# Patient Record
Sex: Female | Born: 1970 | Race: White | Hispanic: No | Marital: Single | Smoking: Never smoker
Health system: Southern US, Community
[De-identification: ages and names within clinical notes are randomized; demographics above are authoritative.]

## PROBLEM LIST (undated history)

## (undated) DIAGNOSIS — Z9889 Other specified postprocedural states: Secondary | ICD-10-CM

## (undated) DIAGNOSIS — N200 Calculus of kidney: Secondary | ICD-10-CM

## (undated) DIAGNOSIS — E119 Type 2 diabetes mellitus without complications: Secondary | ICD-10-CM

## (undated) DIAGNOSIS — E785 Hyperlipidemia, unspecified: Secondary | ICD-10-CM

## (undated) DIAGNOSIS — Z87442 Personal history of urinary calculi: Secondary | ICD-10-CM

## (undated) HISTORY — DX: Calculus of kidney: N20.0

## (undated) HISTORY — DX: Hyperlipidemia, unspecified: E78.5

## (undated) HISTORY — DX: Type 2 diabetes mellitus without complications: E11.9

## (undated) HISTORY — PX: CARPAL TUNNEL RELEASE: SHX101

## (undated) HISTORY — PX: WISDOM TOOTH EXTRACTION: SHX21

## (undated) HISTORY — PX: TUBAL LIGATION: SHX77

## (undated) HISTORY — PX: CHOLECYSTECTOMY: SHX55

---

## 2011-08-19 DIAGNOSIS — H524 Presbyopia: Secondary | ICD-10-CM | POA: Insufficient documentation

## 2011-08-19 DIAGNOSIS — G902 Horner's syndrome: Secondary | ICD-10-CM | POA: Insufficient documentation

## 2011-08-19 DIAGNOSIS — H209 Unspecified iridocyclitis: Secondary | ICD-10-CM | POA: Insufficient documentation

## 2011-08-19 DIAGNOSIS — H52209 Unspecified astigmatism, unspecified eye: Secondary | ICD-10-CM | POA: Insufficient documentation

## 2016-06-02 DIAGNOSIS — N200 Calculus of kidney: Secondary | ICD-10-CM

## 2016-06-02 HISTORY — DX: Calculus of kidney: N20.0

## 2016-06-02 HISTORY — PX: LITHOTRIPSY: SUR834

## 2017-07-03 ENCOUNTER — Encounter (HOSPITAL_COMMUNITY): Payer: Self-pay

## 2017-07-03 ENCOUNTER — Emergency Department (HOSPITAL_COMMUNITY): Payer: Self-pay

## 2017-07-03 DIAGNOSIS — E119 Type 2 diabetes mellitus without complications: Secondary | ICD-10-CM | POA: Insufficient documentation

## 2017-07-03 DIAGNOSIS — Y939 Activity, unspecified: Secondary | ICD-10-CM | POA: Insufficient documentation

## 2017-07-03 DIAGNOSIS — S53402A Unspecified sprain of left elbow, initial encounter: Secondary | ICD-10-CM | POA: Insufficient documentation

## 2017-07-03 DIAGNOSIS — Y929 Unspecified place or not applicable: Secondary | ICD-10-CM | POA: Insufficient documentation

## 2017-07-03 DIAGNOSIS — Y999 Unspecified external cause status: Secondary | ICD-10-CM | POA: Insufficient documentation

## 2017-07-03 DIAGNOSIS — W010XXA Fall on same level from slipping, tripping and stumbling without subsequent striking against object, initial encounter: Secondary | ICD-10-CM | POA: Insufficient documentation

## 2017-07-03 NOTE — ED Triage Notes (Signed)
Pt states she fell on Monday, landed mostly on her right hand and arm on the pavement in a parking lot, states she has been having pain to her left elbow but does not think she hit it on anything.

## 2017-07-04 ENCOUNTER — Emergency Department (HOSPITAL_COMMUNITY)
Admission: EM | Admit: 2017-07-04 | Discharge: 2017-07-04 | Disposition: A | Discharge status: Home / Self Care | Payer: Self-pay | Attending: Emergency Medicine | Admitting: Emergency Medicine

## 2017-07-04 DIAGNOSIS — S53402A Unspecified sprain of left elbow, initial encounter: Secondary | ICD-10-CM

## 2017-07-04 HISTORY — DX: Type 2 diabetes mellitus without complications: E11.9

## 2017-07-04 MED ORDER — TRAMADOL HCL 50 MG PO TABS
50.0000 mg | ORAL_TABLET | Freq: Once | ORAL | Status: AC
  Administered 2017-07-04: 50 mg via ORAL
  Filled 2017-07-04: qty 1

## 2017-07-04 MED ORDER — TRAMADOL HCL 50 MG PO TABS: 50.0000 mg | ORAL_TABLET | Freq: Four times a day (QID) | ORAL | 0 refills | Status: DC | PRN

## 2017-07-04 NOTE — ED Provider Notes (Signed)
[facility] EMERGENCY DEPARTMENT Provider Note   CSN: [id number] Arrival date & time: 07/03/17  2314     History   Chief Complaint Chief Complaint  Patient presents with  . Fall    elbow injury    HPI Kendra Wilson is a 47 y.o. female.  HPI   Kendra Wilson is a 47 y.o. female who presents to the Emergency Department complaining of left elbow pain for 4 days.  She describes a mechanical fall that occurred on Monday in which she landed on her left elbow.  She complains of continued pain and swelling to the joint.  She is left-hand dominant and states she is continue to use her hand at work.  She has been taking ibuprofen without relief.  Pain is worse with movement, improves slightly with rest.  She denies other injuries, numbness, weakness of the extremity neck wrist or shoulder pain.   Past Medical History:  Diagnosis Date  . Diabetes mellitus without complication (HCC)     There are no active problems to display for this patient.   History reviewed. No pertinent surgical history.  OB History    No data available       Home Medications    Prior to Admission medications   Medication Sig Start Date End Date Taking? Authorizing Provider  traMADol (ULTRAM) 50 MG tablet Take 1 tablet (50 mg total) by mouth every 6 (six) hours as needed. 07/04/17   Pauline Aus, PA-C    Family History No family history on file.  Social History Social History   Tobacco Use  . Smoking status: Never Smoker  . Smokeless tobacco: Never Used  Substance Use Topics  . Alcohol use: No    Frequency: Never  . Drug use: No     Allergies   Codeine; Demerol [meperidine]; and Morphine and related   Review of Systems Review of Systems  Constitutional: Negative for chills and fever.  Cardiovascular: Negative for chest pain.  Musculoskeletal: Positive for arthralgias (Left elbow pain) and joint swelling. Negative for neck pain.  Skin: Negative for color change and wound.    Neurological: Negative for dizziness, weakness, numbness and headaches.  All other systems reviewed and are negative.    Physical Exam Updated Vital Signs BP 116/74 (BP Location: Right Arm)   Pulse 97   Temp 98.8 F (37.1 C)   Resp 16   Ht 5\' 7"  (1.702 m)   Wt 86.2 kg (190 lb)   LMP 06/21/2017 (Approximate)   SpO2 98%   BMI 29.76 kg/m   Physical Exam  Constitutional: She is oriented to person, place, and time. She appears well-developed and well-nourished. No distress.  HENT:  Head: Atraumatic.  Neck: Normal range of motion. Neck supple.  Cardiovascular: Normal rate, regular rhythm and intact distal pulses.  Pulmonary/Chest: Effort normal and breath sounds normal. No respiratory distress.  Musculoskeletal: Normal range of motion. She exhibits tenderness. She exhibits no deformity.  Focal tenderness to palpation of the lateral left elbow.  Patient has full range of motion of the joint.  No significant edema noted.  No skin changes.  Left wrist and shoulder are nontender  Neurological: She is alert and oriented to person, place, and time. No cranial nerve deficit.  Skin: Skin is warm. Capillary refill takes less than 2 seconds. No rash noted.  Nursing note and vitals reviewed.     ED Treatments / Results  Labs (all labs ordered are listed, but only abnormal results are displayed) Labs Reviewed -  No data to display  EKG  EKG Interpretation None       Radiology Dg Elbow Complete Left  Result Date: 07/04/2017 CLINICAL DATA:  Left elbow pain after a fall 5 days ago. EXAM: LEFT ELBOW - COMPLETE 3+ VIEW COMPARISON:  None. FINDINGS: There is no evidence of fracture, dislocation, or joint effusion. There is no evidence of arthropathy or other focal bone abnormality. Soft tissues are unremarkable. IMPRESSION: Negative. Electronically Signed   By: Burman NievesWilliam  Stevens M.D.   On: 07/04/2017 00:55    Procedures Procedures (including critical care time)  Medications Ordered in  ED Medications  traMADol (ULTRAM) tablet 50 mg (not administered)     Initial Impression / Assessment and Plan / ED Course  I have reviewed the triage vital signs and the nursing notes.  Pertinent labs & imaging results that were available during my care of the patient were reviewed by me and considered in my medical decision making (see chart for details).     Patient with tenderness of the lateral left elbow, x-rays are negative for fracture or dislocation.  She remains neurovascularly intact.  She has full range of motion of the joint.  Symptoms are likely related to a sprain.  She agrees to symptomatic treatment and close orthopedic follow-up in 1 week if not improving.  Sling applied by nursing staff for comfort.  Final Clinical Impressions(s) / ED Diagnoses   Final diagnoses:  Sprain of left elbow, initial encounter    ED Discharge Orders        Ordered    traMADol (ULTRAM) 50 MG tablet  Every 6 hours PRN     07/04/17 0111       Pauline Ausriplett, Alli Jasmer, PA-C 07/04/17 0127    Glynn Octaveancour, Stephen, MD 07/04/17 [phone]

## 2017-07-04 NOTE — Discharge Instructions (Signed)
Apply ice packs on and off to your elbow, or the sling as needed for comfort.  Call one of the orthopedic providers listed to arrange a follow-up appointment in 1 week if not improving.

## 2017-12-30 ENCOUNTER — Ambulatory Visit: Payer: Self-pay | Admitting: Physician Assistant

## 2017-12-30 ENCOUNTER — Encounter: Payer: Self-pay | Admitting: Physician Assistant

## 2017-12-30 VITALS — BP 121/73 | HR 64 | Temp 98.3°F | Ht 66.0 in | Wt 186.0 lb

## 2017-12-30 DIAGNOSIS — H21562 Pupillary abnormality, left eye: Secondary | ICD-10-CM | POA: Insufficient documentation

## 2017-12-30 DIAGNOSIS — E1165 Type 2 diabetes mellitus with hyperglycemia: Secondary | ICD-10-CM

## 2017-12-30 DIAGNOSIS — Z7689 Persons encountering health services in other specified circumstances: Secondary | ICD-10-CM

## 2017-12-30 DIAGNOSIS — Z8639 Personal history of other endocrine, nutritional and metabolic disease: Secondary | ICD-10-CM

## 2017-12-30 LAB — GLUCOSE, POCT (MANUAL RESULT ENTRY): POC Glucose: 234 mg/dl — AB (ref 70–99)

## 2017-12-30 MED ORDER — SITAGLIPTIN PHOSPHATE 100 MG PO TABS: 100.0000 mg | ORAL_TABLET | Freq: Every day | ORAL | 0 refills | Status: DC

## 2017-12-30 MED ORDER — COLESTIPOL HCL 1 G PO TABS: 1.0000 g | ORAL_TABLET | Freq: Three times a day (TID) | ORAL | 0 refills | Status: DC | PRN

## 2017-12-30 NOTE — Progress Notes (Signed)
BP 121/73   Pulse 64   Temp 98.3 F (36.8 C)   LMP 10/14/2017 (Approximate)   SpO2 100%    Subjective:    Patient ID: Kendra Wilson, female    DOB: 05-Mar-1971, 47 y.o.   MRN: [id number]  HPI: Kendra Wilson is a 47 y.o. female presenting on 12/30/2017 for New Patient (Initial Visit)   HPI   Last PCP in [place] last October 2018.  She has history DM- she was on victoza.  She had been on metformin but says it gave her diarrhea.     LMP May 2019.  It is irregular.   Last PAP and mammogram March 2018.  Pt says she was on colestipol 1g tid to help with diarrhea.  Pt states L pupil won't dialate or react to light since she was in her early 50s while under the care of an ophthologist.  Relevant past medical, surgical, family and social history reviewed and updated as indicated. Interim medical history since our last visit reviewed. Allergies and medications reviewed and updated.  No current outpatient medications on file.   Review of Systems  Constitutional: Positive for chills, diaphoresis and fatigue. Negative for appetite change, fever and unexpected weight change.  HENT: Negative for congestion, drooling, ear pain, facial swelling, hearing loss, mouth sores, sneezing, sore throat, trouble swallowing and voice change.   Eyes: Negative for pain, discharge, redness, itching and visual disturbance.  Respiratory: Negative for cough, choking, shortness of breath and wheezing.   Cardiovascular: Negative for chest pain, palpitations and leg swelling.  Gastrointestinal: Positive for diarrhea. Negative for abdominal pain, blood in stool, constipation and vomiting.  Endocrine: Positive for polydipsia. Negative for cold intolerance and heat intolerance.  Genitourinary: Negative for decreased urine volume, dysuria and hematuria.  Musculoskeletal: Positive for arthralgias. Negative for back pain and gait problem.  Skin: Negative for rash.  Allergic/Immunologic: Positive for  environmental allergies.  Neurological: Positive for headaches. Negative for seizures, syncope and light-headedness.  Hematological: Negative for adenopathy.  Psychiatric/Behavioral: Negative for agitation, dysphoric mood and suicidal ideas. The patient is not nervous/anxious.     Per HPI unless specifically indicated above     Objective:    BP 121/73   Pulse 64   Temp 98.3 F (36.8 C)   LMP 10/14/2017 (Approximate)   SpO2 100%   Wt Readings from Last 3 Encounters:  07/03/17 190 lb (86.2 kg)    Physical Exam  Constitutional: She is oriented to person, place, and time. She appears well-developed and well-nourished.  HENT:  Head: Normocephalic and atraumatic.  Mouth/Throat: Oropharynx is clear and moist. No oropharyngeal exudate.  Eyes: Conjunctivae and EOM are normal. Left pupil is not reactive.  Neck: Neck supple. No thyromegaly present.  Cardiovascular: Normal rate and regular rhythm.  Pulmonary/Chest: Effort normal and breath sounds normal.  Abdominal: Soft. Bowel sounds are normal. She exhibits no mass. There is no hepatosplenomegaly. There is no tenderness.  Musculoskeletal: She exhibits no edema.  Lymphadenopathy:    She has no cervical adenopathy.  Neurological: She is alert and oriented to person, place, and time. Gait normal.  Skin: Skin is warm and dry.  Psychiatric: She has a normal mood and affect. Her behavior is normal.  Vitals reviewed.   L pupil not reactive  No results found for this or any previous visit.    Assessment & Plan:   Encounter Diagnoses  Name Primary?  . Encounter to establish care Yes  . Type 2 diabetes mellitus with hyperglycemia, unspecified  whether long term insulin use (HCC)   . History of diabetes mellitus   . Pupillary abnormality, left eye      -pt was given Samples [place] -She was signed up for [facility] and rx sent -pt to get Baseline labs -record request sent to her previous PCP for last pap and mammogram -pt given rx  and coupon for colestid which she uses to help her chronic diarrhea -pt to follow up 1 month.  RTO sooner prn

## 2017-12-31 ENCOUNTER — Other Ambulatory Visit (HOSPITAL_COMMUNITY)
Admission: RE | Admit: 2017-12-31 | Discharge: 2017-12-31 | Disposition: A | Discharge status: Home / Self Care | Payer: Self-pay | Source: Ambulatory Visit | Attending: Physician Assistant | Admitting: Physician Assistant

## 2017-12-31 ENCOUNTER — Other Ambulatory Visit: Payer: Self-pay | Admitting: Physician Assistant

## 2017-12-31 DIAGNOSIS — E1165 Type 2 diabetes mellitus with hyperglycemia: Secondary | ICD-10-CM

## 2017-12-31 DIAGNOSIS — Z130 Encounter for screening for diseases of the blood and blood-forming organs and certain disorders involving the immune mechanism: Secondary | ICD-10-CM | POA: Insufficient documentation

## 2017-12-31 LAB — COMPREHENSIVE METABOLIC PANEL
ALBUMIN: 3.9 g/dL (ref 3.5–5.0)
ALT: 48 U/L — AB (ref 0–44)
AST: 37 U/L (ref 15–41)
Alkaline Phosphatase: 109 U/L (ref 38–126)
Anion gap: 7 (ref 5–15)
BUN: 12 mg/dL (ref 6–20)
CHLORIDE: 103 mmol/L (ref 98–111)
CO2: 28 mmol/L (ref 22–32)
Calcium: 8.9 mg/dL (ref 8.9–10.3)
Creatinine, Ser: 0.86 mg/dL (ref 0.44–1.00)
GFR calc Af Amer: >60 mL/min (ref >60)
GFR calc non Af Amer: >60 mL/min (ref >60)
GLUCOSE: 190 mg/dL — AB (ref 70–99)
POTASSIUM: 4 mmol/L (ref 3.5–5.1)
Sodium: 138 mmol/L (ref 135–145)
Total Bilirubin: 1.1 mg/dL (ref 0.3–1.2)
Total Protein: 7.3 g/dL (ref 6.5–8.1)

## 2017-12-31 LAB — LIPID PANEL
Cholesterol: 196 mg/dL (ref 0–200)
HDL: 38 mg/dL — ABNORMAL LOW (ref >40)
LDL CALC: 116 mg/dL — AB (ref 0–99)
Total CHOL/HDL Ratio: 5.2 RATIO
Triglycerides: 211 mg/dL — ABNORMAL HIGH (ref <150)
VLDL: 42 mg/dL — AB (ref 0–40)

## 2017-12-31 LAB — CBC
HCT: 41 % (ref 36.0–46.0)
HEMOGLOBIN: 13.9 g/dL (ref 12.0–15.0)
MCH: 31 pg (ref 26.0–34.0)
MCHC: 33.9 g/dL (ref 30.0–36.0)
MCV: 91.5 fL (ref 78.0–100.0)
Platelets: 226 10*3/uL (ref 150–400)
RBC: 4.48 MIL/uL (ref 3.87–5.11)
RDW: 11.6 % (ref 11.5–15.5)
WBC: 3.8 10*3/uL — ABNORMAL LOW (ref 4.0–10.5)

## 2017-12-31 LAB — HEMOGLOBIN A1C
Hgb A1c MFr Bld: 8.2 % — ABNORMAL HIGH (ref 4.8–5.6)
Mean Plasma Glucose: 188.64 mg/dL

## 2018-01-01 ENCOUNTER — Other Ambulatory Visit: Payer: Self-pay | Admitting: Physician Assistant

## 2018-01-01 LAB — MICROALBUMIN, URINE: Microalb, Ur: 37.6 ug/mL — ABNORMAL HIGH

## 2018-01-28 ENCOUNTER — Encounter: Payer: Self-pay | Admitting: Physician Assistant

## 2018-01-28 ENCOUNTER — Ambulatory Visit: Payer: Self-pay | Admitting: Physician Assistant

## 2018-01-28 VITALS — BP 116/72 | HR 76 | Temp 97.7°F | Ht 66.0 in | Wt 188.0 lb

## 2018-01-28 DIAGNOSIS — K529 Noninfective gastroenteritis and colitis, unspecified: Secondary | ICD-10-CM

## 2018-01-28 DIAGNOSIS — Z1239 Encounter for other screening for malignant neoplasm of breast: Secondary | ICD-10-CM

## 2018-01-28 DIAGNOSIS — E785 Hyperlipidemia, unspecified: Secondary | ICD-10-CM | POA: Insufficient documentation

## 2018-01-28 DIAGNOSIS — E1165 Type 2 diabetes mellitus with hyperglycemia: Secondary | ICD-10-CM

## 2018-01-28 MED ORDER — COLESTIPOL HCL 1 G PO TABS: 1.0000 g | ORAL_TABLET | Freq: Three times a day (TID) | ORAL | 4 refills | Status: DC | PRN

## 2018-01-28 NOTE — Progress Notes (Signed)
BP 116/72 (BP Location: Right Arm, Patient Position: Sitting, Cuff Size: Normal)   Pulse 76   Temp 97.7 F (36.5 C)   Ht 5\' 6"  (1.676 m)   Wt 188 lb (85.3 kg)   SpO2 99%   BMI 30.34 kg/m    Subjective:    Patient ID: Kendra DuralJennifer Wilson, female    DOB: 11/27/1970, 47 y.o.   MRN: [id number]  HPI: Kendra Wilson is a 47 y.o. female presenting on 01/28/2018 for Diabetes   HPI  She is tolerating the [place].  She got a supply from [facility]  Reviewed PAP and mammogram records from previous PCP  Pt is doing well today and has no complaints  Pt was on crestor and lipitor in the past and she did not tolerate them- she says they made her ankles swell.   Relevant past medical, surgical, family and social history reviewed and updated as indicated. Interim medical history since our last visit reviewed. Allergies and medications reviewed and updated.   Current Outpatient Medications:  .  colestipol (COLESTID) 1 g tablet, Take 1 tablet (1 g total) by mouth 3 (three) times daily as needed., Disp: 90 tablet, Rfl: 0 .  sitaGLIPtin (JANUVIA) 100 MG tablet, Take 1 tablet (100 mg total) by mouth daily., Disp: 90 tablet, Rfl: 0   Review of Systems  Constitutional: Positive for chills, diaphoresis and fatigue. Negative for appetite change, fever and unexpected weight change.  HENT: Negative for congestion, dental problem, drooling, ear pain, facial swelling, hearing loss, mouth sores, sneezing, sore throat, trouble swallowing and voice change.   Eyes: Positive for visual disturbance. Negative for pain, discharge, redness and itching.  Respiratory: Negative for cough, choking, shortness of breath and wheezing.   Cardiovascular: Negative for chest pain, palpitations and leg swelling.  Gastrointestinal: Negative for abdominal pain, blood in stool, constipation, diarrhea and vomiting.  Endocrine: Negative for cold intolerance, heat intolerance and polydipsia.  Genitourinary: Negative for  decreased urine volume, dysuria and hematuria.  Musculoskeletal: Positive for arthralgias. Negative for back pain and gait problem.  Skin: Negative for rash.  Allergic/Immunologic: Positive for environmental allergies.  Neurological: Positive for headaches. Negative for seizures, syncope and light-headedness.  Hematological: Negative for adenopathy.  Psychiatric/Behavioral: Negative for agitation, dysphoric mood and suicidal ideas. The patient is not nervous/anxious.     Per HPI unless specifically indicated above     Objective:    BP 116/72 (BP Location: Right Arm, Patient Position: Sitting, Cuff Size: Normal)   Pulse 76   Temp 97.7 F (36.5 C)   Ht 5\' 6"  (1.676 m)   Wt 188 lb (85.3 kg)   SpO2 99%   BMI 30.34 kg/m   Wt Readings from Last 3 Encounters:  01/28/18 188 lb (85.3 kg)  12/30/17 186 lb (84.4 kg)  07/03/17 190 lb (86.2 kg)    Physical Exam  Constitutional: She is oriented to person, place, and time. She appears well-developed and well-nourished.  HENT:  Head: Normocephalic and atraumatic.  Neck: Neck supple.  Cardiovascular: Normal rate and regular rhythm.  Pulmonary/Chest: Effort normal and breath sounds normal.  Abdominal: Soft. Bowel sounds are normal. She exhibits no mass. There is no hepatosplenomegaly. There is no tenderness.  Musculoskeletal: She exhibits no edema.  Lymphadenopathy:    She has no cervical adenopathy.  Neurological: She is alert and oriented to person, place, and time.  Skin: Skin is warm and dry.  Psychiatric: She has a normal mood and affect. Her behavior is normal.  Vitals reviewed.  Results for orders placed or performed during the hospital encounter of 12/31/17  CBC  Result Value Ref Range   WBC 3.8 (L) 4.0 - 10.5 K/uL   RBC 4.48 3.87 - 5.11 MIL/uL   Hemoglobin 13.9 12.0 - 15.0 g/dL   HCT 16.1 09.6 - 04.5 %   MCV 91.5 78.0 - 100.0 fL   MCH 31.0 26.0 - 34.0 pg   MCHC 33.9 30.0 - 36.0 g/dL   RDW 40.9 81.1 - 91.4 %   Platelets  226 150 - 400 K/uL  Lipid panel  Result Value Ref Range   Cholesterol 196 0 - 200 mg/dL   Triglycerides 782 (H) <150 mg/dL   HDL 38 (L) >95 mg/dL   Total CHOL/HDL Ratio 5.2 RATIO   VLDL 42 (H) 0 - 40 mg/dL   LDL Cholesterol 621 (H) 0 - 99 mg/dL  Comprehensive metabolic panel  Result Value Ref Range   Sodium 138 135 - 145 mmol/L   Potassium 4.0 3.5 - 5.1 mmol/L   Chloride 103 98 - 111 mmol/L   CO2 28 22 - 32 mmol/L   Glucose, Bld 190 (H) 70 - 99 mg/dL   BUN 12 6 - 20 mg/dL   Creatinine, Ser 3.08 0.44 - 1.00 mg/dL   Calcium 8.9 8.9 - 65.7 mg/dL   Total Protein 7.3 6.5 - 8.1 g/dL   Albumin 3.9 3.5 - 5.0 g/dL   AST 37 15 - 41 U/L   ALT 48 (H) 0 - 44 U/L   Alkaline Phosphatase 109 38 - 126 U/L   Total Bilirubin 1.1 0.3 - 1.2 mg/dL   GFR calc non Af Amer >60 >60 mL/min   GFR calc Af Amer >60 >60 mL/min   Anion gap 7 5 - 15  Microalbumin, urine  Result Value Ref Range   Microalb, Ur 37.6 (H) Not Estab. ug/mL  Hemoglobin A1c  Result Value Ref Range   Hgb A1c MFr Bld 8.2 (H) 4.8 - 5.6 %   Mean Plasma Glucose 188.64 mg/dL      Assessment & Plan:   Encounter Diagnoses  Name Primary?  . Type 2 diabetes mellitus with hyperglycemia, unspecified whether long term insulin use (HCC) Yes  . Hyperlipidemia, unspecified hyperlipidemia type   . Chronic diarrhea   . Screening for breast cancer     -reviewed labs with pt -Foot exam done -gave pt contact information to call and get Screening mammogram -pt to Continue colestid and [place] -pt to Follow up 3 months with labs before appointment.  She is to RTO sooner prn

## 2018-01-28 NOTE — Patient Instructions (Signed)
[facility] [phone] Call for screening mammogram   

## 2018-02-15 ENCOUNTER — Encounter: Payer: Self-pay | Admitting: Physician Assistant

## 2018-03-25 ENCOUNTER — Other Ambulatory Visit: Payer: Self-pay | Admitting: Physician Assistant

## 2018-03-29 ENCOUNTER — Other Ambulatory Visit (HOSPITAL_COMMUNITY)
Admission: RE | Admit: 2018-03-29 | Discharge: 2018-03-29 | Disposition: A | Discharge status: Home / Self Care | Payer: Self-pay | Source: Ambulatory Visit | Attending: Physician Assistant | Admitting: Physician Assistant

## 2018-03-29 DIAGNOSIS — E1165 Type 2 diabetes mellitus with hyperglycemia: Secondary | ICD-10-CM | POA: Insufficient documentation

## 2018-03-29 DIAGNOSIS — E785 Hyperlipidemia, unspecified: Secondary | ICD-10-CM | POA: Insufficient documentation

## 2018-03-29 LAB — COMPREHENSIVE METABOLIC PANEL
ALT: 27 U/L (ref 0–44)
AST: 24 U/L (ref 15–41)
Albumin: 4 g/dL (ref 3.5–5.0)
Alkaline Phosphatase: 82 U/L (ref 38–126)
Anion gap: 9 (ref 5–15)
BILIRUBIN TOTAL: 1 mg/dL (ref 0.3–1.2)
BUN: 12 mg/dL (ref 6–20)
CO2: 27 mmol/L (ref 22–32)
CREATININE: 0.76 mg/dL (ref 0.44–1.00)
Calcium: 8.9 mg/dL (ref 8.9–10.3)
Chloride: 102 mmol/L (ref 98–111)
GFR calc Af Amer: >60 mL/min (ref >60)
Glucose, Bld: 151 mg/dL — ABNORMAL HIGH (ref 70–99)
POTASSIUM: 3.8 mmol/L (ref 3.5–5.1)
Sodium: 138 mmol/L (ref 135–145)
TOTAL PROTEIN: 7.8 g/dL (ref 6.5–8.1)

## 2018-03-29 LAB — LIPID PANEL
Cholesterol: 205 mg/dL — ABNORMAL HIGH (ref 0–200)
HDL: 40 mg/dL — AB (ref >40)
LDL CALC: 130 mg/dL — AB (ref 0–99)
TRIGLYCERIDES: 174 mg/dL — AB (ref <150)
Total CHOL/HDL Ratio: 5.1 RATIO
VLDL: 35 mg/dL (ref 0–40)

## 2018-03-29 LAB — HEMOGLOBIN A1C
HEMOGLOBIN A1C: 7.3 % — AB (ref 4.8–5.6)
Mean Plasma Glucose: 162.81 mg/dL

## 2018-04-05 ENCOUNTER — Ambulatory Visit: Payer: Self-pay | Admitting: Physician Assistant

## 2018-04-05 ENCOUNTER — Encounter: Payer: Self-pay | Admitting: Physician Assistant

## 2018-04-05 VITALS — BP 104/70 | HR 85 | Temp 97.9°F | Ht 66.0 in | Wt 188.0 lb

## 2018-04-05 DIAGNOSIS — E785 Hyperlipidemia, unspecified: Secondary | ICD-10-CM

## 2018-04-05 DIAGNOSIS — E1165 Type 2 diabetes mellitus with hyperglycemia: Secondary | ICD-10-CM

## 2018-04-05 NOTE — Progress Notes (Signed)
BP 104/70 (BP Location: Left Arm, Patient Position: Sitting, Cuff Size: Normal)   Pulse 85   Temp 97.9 F (36.6 C)   Ht 5\' 6"  (1.676 m)   Wt 188 lb (85.3 kg)   SpO2 98%   BMI 30.34 kg/m    Subjective:    Patient ID: Kendra Wilson, female    DOB: [birth year], 47 y.o.   MRN: [id number]  HPI: Kendra Wilson is a 47 y.o. female presenting on 04/05/2018 for Diabetes   HPI   Pt says the mammogram people called and said they were full for free screenings and that she is on waiting list for mammogram  Pt says she is doing well and has no complaints today  Relevant past medical, surgical, family and social history reviewed and updated as indicated. Interim medical history since our last visit reviewed. Allergies and medications reviewed and updated.   Current Outpatient Medications:  .  colestipol (COLESTID) 1 g tablet, Take 1 tablet (1 g total) by mouth 3 (three) times daily as needed., Disp: 90 tablet, Rfl: 4 .  JANUVIA 100 MG tablet, TAKE 1 Tablet BY MOUTH ONCE DAILY, Disp: 90 tablet, Rfl: 0   Review of Systems  Constitutional: Negative for appetite change, chills, diaphoresis, fatigue, fever and unexpected weight change.  HENT: Negative for congestion, dental problem, drooling, ear pain, facial swelling, hearing loss, mouth sores, sneezing, sore throat, trouble swallowing and voice change.   Eyes: Negative for pain, discharge, redness, itching and visual disturbance.  Respiratory: Negative for cough, choking, shortness of breath and wheezing.   Cardiovascular: Negative for chest pain, palpitations and leg swelling.  Gastrointestinal: Negative for abdominal pain, blood in stool, constipation, diarrhea and vomiting.  Endocrine: Negative for cold intolerance, heat intolerance and polydipsia.  Genitourinary: Negative for decreased urine volume, dysuria and hematuria.  Musculoskeletal: Negative for arthralgias, back pain and gait problem.  Skin: Negative for rash.   Allergic/Immunologic: Negative for environmental allergies.  Neurological: Negative for seizures, syncope, light-headedness and headaches.  Hematological: Negative for adenopathy.  Psychiatric/Behavioral: Negative for agitation, dysphoric mood and suicidal ideas. The patient is not nervous/anxious.     Per HPI unless specifically indicated above     Objective:    BP 104/70 (BP Location: Left Arm, Patient Position: Sitting, Cuff Size: Normal)   Pulse 85   Temp 97.9 F (36.6 C)   Ht 5\' 6"  (1.676 m)   Wt 188 lb (85.3 kg)   SpO2 98%   BMI 30.34 kg/m   Wt Readings from Last 3 Encounters:  04/05/18 188 lb (85.3 kg)  01/28/18 188 lb (85.3 kg)  12/30/17 186 lb (84.4 kg)    Physical Exam  Constitutional: She is oriented to person, place, and time. She appears well-developed and well-nourished.  HENT:  Head: Normocephalic and atraumatic.  Neck: Neck supple.  Cardiovascular: Normal rate and regular rhythm.  Pulmonary/Chest: Effort normal and breath sounds normal.  Abdominal: Soft. Bowel sounds are normal. She exhibits no mass. There is no hepatosplenomegaly. There is no tenderness.  Musculoskeletal: She exhibits no edema.  Lymphadenopathy:    She has no cervical adenopathy.  Neurological: She is alert and oriented to person, place, and time.  Skin: Skin is warm and dry.  Psychiatric: She has a normal mood and affect. Her behavior is normal.  Vitals reviewed.   Results for orders placed or performed during the hospital encounter of 03/29/18  Lipid panel  Result Value Ref Range   Cholesterol 205 (H) 0 - 200 mg/dL  Triglycerides 174 (H) <150 mg/dL   HDL 40 (L) >16 mg/dL   Total CHOL/HDL Ratio 5.1 RATIO   VLDL 35 0 - 40 mg/dL   LDL Cholesterol 109 (H) 0 - 99 mg/dL  Comprehensive metabolic panel  Result Value Ref Range   Sodium 138 135 - 145 mmol/L   Potassium 3.8 3.5 - 5.1 mmol/L   Chloride 102 98 - 111 mmol/L   CO2 27 22 - 32 mmol/L   Glucose, Bld 151 (H) 70 - 99 mg/dL    BUN 12 6 - 20 mg/dL   Creatinine, Ser 6.04 0.44 - 1.00 mg/dL   Calcium 8.9 8.9 - 54.0 mg/dL   Total Protein 7.8 6.5 - 8.1 g/dL   Albumin 4.0 3.5 - 5.0 g/dL   AST 24 15 - 41 U/L   ALT 27 0 - 44 U/L   Alkaline Phosphatase 82 38 - 126 U/L   Total Bilirubin 1.0 0.3 - 1.2 mg/dL   GFR calc non Af Amer >60 >60 mL/min   GFR calc Af Amer >60 >60 mL/min   Anion gap 9 5 - 15  Hemoglobin A1c  Result Value Ref Range   Hgb A1c MFr Bld 7.3 (H) 4.8 - 5.6 %   Mean Plasma Glucose 162.81 mg/dL      Assessment & Plan:   Encounter Diagnoses  Name Primary?  Marland Kitchen Uncontrolled type 2 diabetes mellitus with hyperglycemia (HCC) Yes  . Hyperlipidemia, unspecified hyperlipidemia type     -reviewed labs with pt -pt is intolerant of metformin due to diarrhea -pt counseled to Watch diet and gave reading information -discussed with pt that we May add insulin at next OV if a1c still high -pt to Follow up 3 month.  RTO sooner prn

## 2018-04-05 NOTE — Patient Instructions (Signed)
Fat and Cholesterol Restricted Diet High levels of fat and cholesterol in your blood may lead to various health problems, such as diseases of the heart, blood vessels, gallbladder, liver, and pancreas. Fats are concentrated sources of energy that come in various forms. Certain types of fat, including saturated fat, may be harmful in excess. Cholesterol is a substance needed by your body in small amounts. Your body makes all the cholesterol it needs. Excess cholesterol comes from the food you eat. When you have high levels of cholesterol and saturated fat in your blood, health problems can develop because the excess fat and cholesterol will gather along the walls of your blood vessels, causing them to narrow. Choosing the right foods will help you control your intake of fat and cholesterol. This will help keep the levels of these substances in your blood within normal limits and reduce your risk of disease. What is my plan? Your health care provider recommends that you:  Limit your fat intake to ______% or less of your total calories per day.  Limit the amount of cholesterol in your diet to less than _________mg per day.  Eat 20-30 grams of fiber each day.  What types of fat should I choose?  Choose healthy fats more often. Choose monounsaturated and polyunsaturated fats, such as olive and canola oil, flaxseeds, walnuts, almonds, and seeds.  Eat more omega-3 fats. Good choices include salmon, mackerel, sardines, tuna, flaxseed oil, and ground flaxseeds. Aim to eat fish at least two times a week.  Limit saturated fats. Saturated fats are primarily found in animal products, such as meats, butter, and cream. Plant sources of saturated fats include palm oil, palm kernel oil, and coconut oil.  Avoid foods with partially hydrogenated oils in them. These contain trans fats. Examples of foods that contain trans fats are stick margarine, some tub margarines, cookies, crackers, and other baked goods. What  general guidelines do I need to follow? These guidelines for healthy eating will help you control your intake of fat and cholesterol:  Check food labels carefully to identify foods with trans fats or high amounts of saturated fat.  Fill one half of your plate with vegetables and green salads.  Fill one fourth of your plate with whole grains. Look for the word "whole" as the first word in the ingredient list.  Fill one fourth of your plate with lean protein foods.  Limit fruit to two servings a day. Choose fruit instead of juice.  Eat more foods that contain fiber, such as apples, broccoli, carrots, beans, peas, and barley.  Eat more home-cooked food and less restaurant, buffet, and fast food.  Limit or avoid alcohol.  Limit foods high in starch and sugar.  Limit fried foods.  Cook foods using methods other than frying. Baking, boiling, grilling, and broiling are all great options.  Lose weight if you are overweight. Losing just 5-10% of your initial body weight can help your overall health and prevent diseases such as diabetes and heart disease.  What foods can I eat? Grains  Whole grains, such as whole wheat or whole grain breads, crackers, cereals, and pasta. Unsweetened oatmeal, bulgur, barley, quinoa, or brown rice. Corn or whole wheat flour tortillas. Vegetables  Fresh or frozen vegetables (raw, steamed, roasted, or grilled). Green salads. Fruits  All fresh, canned (in natural juice), or frozen fruits. Meats and other protein foods  Ground beef (85% or leaner), grass-fed beef, or beef trimmed of fat. Skinless chicken or turkey. Ground chicken or turkey.   Pork trimmed of fat. All fish and seafood. Eggs. Dried beans, peas, or lentils. Unsalted nuts or seeds. Unsalted canned or dry beans. Dairy  Low-fat dairy products, such as skim or 1% milk, 2% or reduced-fat cheeses, low-fat ricotta or cottage cheese, or plain low-fat yo Fats and oils  Tub margarines without trans  fats. Light or reduced-fat mayonnaise and salad dressings. Avocado. Olive, canola, sesame, or safflower oils. Natural peanut or almond butter (choose ones without added sugar and oil). The items listed above may not be a complete list of recommended foods or beverages. Contact your dietitian for more options. Foods to avoid Grains  White bread. White pasta. White rice. Cornbread. Bagels, pastries, and croissants. Crackers that contain trans fat. Vegetables  White potatoes. Corn. Creamed or fried vegetables. Vegetables in a cheese sauce. Fruits  Dried fruits. Canned fruit in light or heavy syrup. Fruit juice. Meats and other protein foods  Fatty cuts of meat. Ribs, chicken wings, bacon, sausage, bologna, salami, chitterlings, fatback, hot dogs, bratwurst, and packaged luncheon meats. Liver and organ meats. Dairy  Whole or 2% milk, cream, half-and-half, and cream cheese. Whole milk cheeses. Whole-fat or sweetened yogurt. Full-fat cheeses. Nondairy creamers and whipped toppings. Processed cheese, cheese spreads, or cheese curds. Beverages  Alcohol. Sweetened drinks (such as sodas, lemonade, and fruit drinks or punches). Fats and oils  Butter, stick margarine, lard, shortening, ghee, or bacon fat. Coconut, palm kernel, or palm oils. Sweets and desserts  Corn syrup, sugars, honey, and molasses. Candy. Jam and jelly. Syrup. Sweetened cereals. Cookies, pies, cakes, donuts, muffins, and ice cream. The items listed above may not be a complete list of foods and beverages to avoid. Contact your dietitian for more information. This information is not intended to replace advice given to you by your health care provider. Make sure you discuss any questions you have with your health care provider. Document Released: 05/19/2005 Document Revised: 06/09/2014 Document Reviewed: 08/17/2013 Elsevier Interactive Patient Education  2018 [employer].   --------------------------------------------   Diabetes Mellitus and Nutrition When you have diabetes (diabetes mellitus), it is very important to have healthy eating habits because your blood sugar (glucose) levels are greatly affected by what you eat and drink. Eating healthy foods in the appropriate amounts, at about the same times every day, can help you:  Control your blood glucose.  Lower your risk of heart disease.  Improve your blood pressure.  Reach or maintain a healthy weight.  Every person with diabetes is different, and each person has different needs for a meal plan. Your health care provider may recommend that you work with a diet and nutrition specialist (dietitian) to make a meal plan that is best for you. Your meal plan may vary depending on factors such as:  The calories you need.  The medicines you take.  Your weight.  Your blood glucose, blood pressure, and cholesterol levels.  Your activity level.  Other health conditions you have, such as heart or kidney disease.  How do carbohydrates affect me? Carbohydrates affect your blood glucose level more than any other type of food. Eating carbohydrates naturally increases the amount of glucose in your blood. Carbohydrate counting is a method for keeping track of how many carbohydrates you eat. Counting carbohydrates is important to keep your blood glucose at a healthy level, especially if you use insulin or take certain oral diabetes medicines. It is important to know how many carbohydrates you can safely have in each meal. This is different for every  person. Your dietitian can help you calculate how many carbohydrates you should have at each meal and for snack. Foods that contain carbohydrates include:  Bread, cereal, rice, pasta, and crackers.  Potatoes and corn.  Peas, beans, and lentils.  Milk and yogurt.  Fruit and juice.  Desserts, such as cakes, cookies, ice cream, and candy.  How does  alcohol affect me? Alcohol can cause a sudden decrease in blood glucose (hypoglycemia), especially if you use insulin or take certain oral diabetes medicines. Hypoglycemia can be a life-threatening condition. Symptoms of hypoglycemia (sleepiness, dizziness, and confusion) are similar to symptoms of having too much alcohol. If your health care provider says that alcohol is safe for you, follow these guidelines:  Limit alcohol intake to no more than 1 drink per day for nonpregnant women and 2 drinks per day for men. One drink equals 12 oz of beer, 5 oz of wine, or 1 oz of hard liquor.  Do not drink on an empty stomach.  Keep yourself hydrated with water, diet soda, or unsweetened iced tea.  Keep in mind that regular soda, juice, and other mixers may contain a lot of sugar and must be counted as carbohydrates.  What are tips for following this plan? Reading food labels  Start by checking the serving size on the label. The amount of calories, carbohydrates, fats, and other nutrients listed on the label are based on one serving of the food. Many foods contain more than one serving per package.  Check the total grams (g) of carbohydrates in one serving. You can calculate the number of servings of carbohydrates in one serving by dividing the total carbohydrates by 15. For example, if a food has 30 g of total carbohydrates, it would be equal to 2 servings of carbohydrates.  Check the number of grams (g) of saturated and trans fats in one serving. Choose foods that have low or no amount of these fats.  Check the number of milligrams (mg) of sodium in one serving. Most people should limit total sodium intake to less than 2,300 mg per day.  Always check the nutrition information of foods labeled as "low-fat" or "nonfat". These foods may be higher in added sugar or refined carbohydrates and should be avoided.  Talk to your dietitian to identify your daily goals for nutrients listed on the  label. Shopping  Avoid buying canned, premade, or processed foods. These foods tend to be high in fat, sodium, and added sugar.  Shop around the outside edge of the grocery store. This includes fresh fruits and vegetables, bulk grains, fresh meats, and fresh dairy. Cooking  Use low-heat cooking methods, such as baking, instead of high-heat cooking methods like deep frying.  Cook using healthy oils, such as olive, canola, or sunflower oil.  Avoid cooking with butter, cream, or high-fat meats. Meal planning  Eat meals and snacks regularly, preferably at the same times every day. Avoid going long periods of time without eating.  Eat foods high in fiber, such as fresh fruits, vegetables, beans, and whole grains. Talk to your dietitian about how many servings of carbohydrates you can eat at each meal.  Eat 4-6 ounces of lean protein each day, such as lean meat, chicken, fish, eggs, or tofu. 1 ounce is equal to 1 ounce of meat, chicken, or fish, 1 egg, or 1/4 cup of tofu.  Eat some foods each day that contain healthy fats, such as avocado, nuts, seeds, and fish. Lifestyle   Check your blood glucose  regularly.  Exercise at least 30 minutes 5 or more days each week, or as told by your health care provider.  Take medicines as told by your health care provider.  Do not use any products that contain nicotine or tobacco, such as cigarettes and e-cigarettes. If you need help quitting, ask your health care provider.  Work with a Veterinary surgeon or diabetes educator to identify strategies to manage stress and any emotional and social challenges. What are some questions to ask my health care provider?  Do I need to meet with a diabetes educator?  Do I need to meet with a dietitian?  What number can I call if I have questions?  When are the best times to check my blood glucose? Where to find more information:  American Diabetes Association: diabetes.org/food-and-fitness/food  Academy of  Nutrition and Dietetics: https://www.vargas.com/  [employer] of Diabetes and Digestive and Kidney Diseases (NIH): FindJewelers.cz Summary  A healthy meal plan will help you control your blood glucose and maintain a healthy lifestyle.  Working with a diet and nutrition specialist (dietitian) can help you make a meal plan that is best for you.  Keep in mind that carbohydrates and alcohol have immediate effects on your blood glucose levels. It is important to count carbohydrates and to use alcohol carefully. This information is not intended to replace advice given to you by your health care provider. Make sure you discuss any questions you have with your health care provider. Document Released: 02/13/2005 Document Revised: 06/23/2016 Document Reviewed: 06/23/2016 Elsevier Interactive Patient Education  [employer].

## 2018-06-22 ENCOUNTER — Other Ambulatory Visit: Payer: Self-pay | Admitting: Physician Assistant

## 2018-06-28 ENCOUNTER — Other Ambulatory Visit (HOSPITAL_COMMUNITY)
Admission: RE | Admit: 2018-06-28 | Discharge: 2018-06-28 | Disposition: A | Discharge status: Home / Self Care | Payer: Self-pay | Source: Ambulatory Visit | Attending: Physician Assistant | Admitting: Physician Assistant

## 2018-06-28 DIAGNOSIS — E785 Hyperlipidemia, unspecified: Secondary | ICD-10-CM | POA: Insufficient documentation

## 2018-06-28 DIAGNOSIS — E1165 Type 2 diabetes mellitus with hyperglycemia: Secondary | ICD-10-CM | POA: Insufficient documentation

## 2018-06-28 LAB — COMPREHENSIVE METABOLIC PANEL
ALBUMIN: 3.6 g/dL (ref 3.5–5.0)
ALT: 42 U/L (ref 0–44)
ANION GAP: 8 (ref 5–15)
AST: 31 U/L (ref 15–41)
Alkaline Phosphatase: 123 U/L (ref 38–126)
BUN: 13 mg/dL (ref 6–20)
CHLORIDE: 104 mmol/L (ref 98–111)
CO2: 24 mmol/L (ref 22–32)
Calcium: 8.7 mg/dL — ABNORMAL LOW (ref 8.9–10.3)
Creatinine, Ser: 0.76 mg/dL (ref 0.44–1.00)
GFR calc Af Amer: >60 mL/min (ref >60)
Glucose, Bld: 206 mg/dL — ABNORMAL HIGH (ref 70–99)
POTASSIUM: 3.9 mmol/L (ref 3.5–5.1)
Sodium: 136 mmol/L (ref 135–145)
Total Bilirubin: 1.2 mg/dL (ref 0.3–1.2)
Total Protein: 7.4 g/dL (ref 6.5–8.1)

## 2018-06-28 LAB — LIPID PANEL
Cholesterol: 224 mg/dL — ABNORMAL HIGH (ref 0–200)
HDL: 45 mg/dL (ref >40)
LDL CALC: 140 mg/dL — AB (ref 0–99)
Total CHOL/HDL Ratio: 5 RATIO
Triglycerides: 194 mg/dL — ABNORMAL HIGH (ref <150)
VLDL: 39 mg/dL (ref 0–40)

## 2018-06-28 LAB — HEMOGLOBIN A1C
HEMOGLOBIN A1C: 7.9 % — AB (ref 4.8–5.6)
Mean Plasma Glucose: 180.03 mg/dL

## 2018-07-06 ENCOUNTER — Ambulatory Visit: Payer: Self-pay | Admitting: Physician Assistant

## 2018-07-06 ENCOUNTER — Encounter: Payer: Self-pay | Admitting: Physician Assistant

## 2018-07-06 VITALS — BP 113/67 | HR 85 | Temp 97.7°F | Ht 66.0 in | Wt 190.5 lb

## 2018-07-06 DIAGNOSIS — E1165 Type 2 diabetes mellitus with hyperglycemia: Secondary | ICD-10-CM

## 2018-07-06 DIAGNOSIS — E785 Hyperlipidemia, unspecified: Secondary | ICD-10-CM

## 2018-07-06 MED ORDER — COLESTIPOL HCL 1 G PO TABS: 2.0000 g | ORAL_TABLET | Freq: Two times a day (BID) | ORAL | 4 refills | Status: DC

## 2018-07-06 NOTE — Patient Instructions (Signed)
Fat and Cholesterol Restricted Eating Plan Eating a diet that limits fat and cholesterol may help lower your risk for heart disease and other conditions. Your body needs fat and cholesterol for basic functions, but eating too much of these things can be harmful to your health. Your health care provider may order lab tests to check your blood fat (lipid) and cholesterol levels. This helps your health care provider understand your risk for certain conditions and whether you need to make diet changes. Work with your health care provider or dietitian to make an eating plan that is right for you. Your plan includes:  Limit your fat intake to ______% or less of your total calories a day.  Limit your saturated fat intake to ______% or less of your total calories a day.  Limit the amount of cholesterol in your diet to less than _________mg a day.  Eat ___________ g of fiber a day. What are tips for following this plan? General guidelines   If you are overweight, work with your health care provider to lose weight safely. Losing just 5-10% of your body weight can improve your overall health and help prevent diseases such as diabetes and heart disease.  Avoid: ? Foods with added sugar. ? Fried foods. ? Foods that contain partially hydrogenated oils, including stick margarine, some tub margarines, cookies, crackers, and other baked goods.  Limit alcohol intake to no more than 1 drink a day for nonpregnant women and 2 drinks a day for men. One drink equals 12 oz of beer, 5 oz of wine, or 1 oz of hard liquor. Reading food labels  Check food labels for: ? Trans fats, partially hydrogenated oils, or high amounts of saturated fat. Avoid foods that contain saturated fat and trans fat. ? The amount of cholesterol in each serving. Try to eat no more than 200 mg of cholesterol each day. ? The amount of fiber in each serving. Try to eat at least 20-30 g of fiber each day.  Choose foods with healthy fats,  such as: ? Monounsaturated and polyunsaturated fats. These include olive and canola oil, flaxseeds, walnuts, almonds, and seeds. ? Omega-3 fats. These are found in foods such as salmon, mackerel, sardines, tuna, flaxseed oil, and ground flaxseeds.  Choose grain products that have whole grains. Look for the word "whole" as the first word in the ingredient list. Cooking  Cook foods using methods other than frying. Baking, boiling, grilling, and broiling are some healthy options.  Eat more home-cooked food and less restaurant, buffet, and fast food.  Avoid cooking using saturated fats. ? Animal sources of saturated fats include meats, butter, and cream. ? Plant sources of saturated fats include palm oil, palm kernel oil, and coconut oil. Meal planning   At meals, imagine dividing your plate into fourths: ? Fill one-half of your plate with vegetables and green salads. ? Fill one-fourth of your plate with whole grains. ? Fill one-fourth of your plate with lean protein foods.  Eat fish that is high in omega-3 fats at least two times a week.  Eat more foods that contain fiber, such as whole grains, beans, apples, broccoli, carrots, peas, and barley. These foods help promote healthy cholesterol levels in the blood. Recommended foods Grains  Whole grains, such as whole wheat or whole grain breads, crackers, cereals, and pasta. Unsweetened oatmeal, bulgur, barley, quinoa, or brown rice. Corn or whole wheat flour tortillas. Vegetables  Fresh or frozen vegetables (raw, steamed, roasted, or grilled). Green salads. Fruits    All fresh, canned (in natural juice), or frozen fruits. Meats and other protein foods  Ground beef (85% or leaner), grass-fed beef, or beef trimmed of fat. Skinless chicken or [place]. Ground chicken or [place]. Pork trimmed of fat. All fish and seafood. Egg whites. Dried beans, peas, or lentils. Unsalted nuts or seeds. Unsalted canned beans. Natural nut butters without added  sugar and oil. Dairy  Low-fat or nonfat dairy products, such as skim or 1% milk, 2% or reduced-fat cheeses, low-fat and fat-free ricotta or cottage cheese, or plain low-fat and nonfat yogurt. Fats and oils  Tub margarine without trans fats. Light or reduced-fat mayonnaise and salad dressings. Avocado. Olive, canola, sesame, or safflower oils. The items listed above may not be a complete list of recommended foods or beverages. Contact your dietitian for more options. Foods to avoid Grains  White bread. White pasta. White rice. Cornbread. Bagels, pastries, and croissants. Crackers and snack foods that contain trans fat and hydrogenated oils. Vegetables  Vegetables cooked in cheese, cream, or butter sauce. Fried vegetables. Fruits  Canned fruit in heavy syrup. Fruit in cream or butter sauce. Fried fruit. Meats and other protein foods  Fatty cuts of meat. Ribs, chicken wings, bacon, sausage, bologna, salami, chitterlings, fatback, hot dogs, bratwurst, and packaged lunch meats. Liver and organ meats. Whole eggs and egg yolks. Chicken and [place] with skin. Fried meat. Dairy  Whole or 2% milk, cream, half-and-half, and cream cheese. Whole milk cheeses. Whole-fat or sweetened yogurt. Full-fat cheeses. Nondairy creamers and whipped toppings. Processed cheese, cheese spreads, and cheese curds. Beverages  Alcohol. Sugar-sweetened drinks such as sodas, lemonade, and fruit drinks. Fats and oils  Butter, stick margarine, lard, shortening, ghee, or bacon fat. Coconut, palm kernel, and palm oils. Sweets and desserts  Corn syrup, sugars, honey, and molasses. Candy. Jam and jelly. Syrup. Sweetened cereals. Cookies, pies, cakes, donuts, muffins, and ice cream. The items listed above may not be a complete list of foods and beverages to avoid. Contact your dietitian for more information. Summary  Your body needs fat and cholesterol for basic functions. However, eating too much of these things can be  harmful to your health.  Work with your health care provider and dietitian to follow a diet low in fat and cholesterol. Doing this may help lower your risk for heart disease and other conditions.  Choose healthy fats, such as monounsaturated and polyunsaturated fats, and foods high in omega-3 fatty acids.  Eat fiber-rich foods, such as whole grains, beans, peas, fruits, and vegetables.  Limit or avoid alcohol, fried foods, and foods high in saturated fats, partially hydrogenated oils, and sugar. This information is not intended to replace advice given to you by your health care provider. Make sure you discuss any questions you have with your health care provider. Document Released: 05/19/2005 Document Revised: 02/03/2017 Document Reviewed: 02/03/2017 Elsevier Interactive Patient Education  2019 [employer]    Insulin Injection Instructions, Using Insulin Pens, Adult A subcutaneous injection is a shot of medicine that is injected into the layer of fat and tissue between skin and muscle. People with type 1 diabetes must take insulin because their bodies do not make it. People with type 2 diabetes may need to take insulin. There are many different types of insulin. The type of insulin that you take may determine how many injections you give yourself and when you need to give the injections. Supplies needed:  Soap and water to wash hands.  Your insulin pen.  A new, unused  needle.  Alcohol wipes.  A disposal container that is meant for sharp items (sharps container), such as an empty plastic bottle with a cover. How to choose a site for injection The body absorbs insulin differently, depending on where the insulin is injected (injection site). It is best to inject insulin into the same body area each time (for example, always in the abdomen), but you should use a different spot in that area for each injection. Do not inject the insulin in the same spot each time. There are five main areas  that can be used for injecting. These areas include:  Abdomen. This is the preferred area.  Front of thigh.  Upper, outer side of thigh.  Upper, outer side of arm.  Upper, outer part of buttock. How to use an insulin pen  First, follow the steps for Get ready, then continue with the steps for Inject the insulin. Get ready 1. Wash your hands with soap and water. If soap and water are not available, use hand sanitizer. 2. Before you give yourself an insulin injection, be sure to test your blood sugar level (blood glucose level) and write down that number. Follow any instructions from your health care provider about what to do if your blood glucose level is higher or lower than your normal range. 3. Check the expiration date and the type of insulin that is in the pen. 4. If you are using CLEAR insulin, check to see that it is clear and free of clumps. 5. If you are using CLOUDY insulin, do not shake the pen to get the injection ready. Instead, get it ready in one of these ways: ? Gently roll the pen between your palms several times. ? Tip the pen up and down several times. 6. Remove the cap from the insulin pen. 7. Use an alcohol wipe to clean the rubber tip of the pen. 8. Remove the protective paper tab from the disposable needle. Do not let the needle touch anything. 9. Screw a new, unused needle onto the pen. 10. Remove the outer plastic needle cover. Do not throw away the outer plastic cover yet. ? If the pen uses a special safety needle, leave the inner needle shield in place. ? If the pen does not use a special safety needle, remove the inner plastic cover from the needle. 11. Follow the manufacturer's instructions to prime the insulin pen with the volume of insulin needed. Hold the pen with the needle pointing up, and push the button on the opposite end of the pen until a drop of insulin appears at the needle tip. If no insulin appears, repeat this step. 12. Turn the button (dial)  to the number of units of insulin that you will be injecting. Inject the insulin 1. Use an alcohol wipe to clean the site where you will be injecting the needle. Let the site air-dry. 2. Hold the pen in the palm of your writing hand like a pencil. 3. If directed by your health care provider, use your other hand to pinch and hold about an inch (2.5 cm) of skin at the injection site. Do not directly touch the cleaned part of the skin. 4. Gently but quickly, use your writing hand to put the needle straight into the skin. The needle should be at a 90-degree angle (perpendicular) to the skin. 5. When the needle is completely inserted into the skin, use your thumb or index finger of your writing hand to push the top button of the  pen down all the way to inject the insulin. 6. Let go of the skin that you are pinching. Continue to hold the pen in place with your writing hand. 7. Wait 10 seconds, then pull the needle straight out of the skin. This will allow all of the insulin to go from the pen and needle into your body. 8. Carefully put the larger (outer) plastic cover of the needle back over the needle, then unscrew the capped needle and discard it in a sharps container, such as an empty plastic bottle with a cover. 9. Put the plastic cap back on the insulin pen. How to throw away supplies  Discard all used needles in a puncture-proof sharps disposal container. You can ask your local pharmacy about where you can get this kind of disposal container, or you can use an empty plastic liquid laundry detergent bottle that has a cover.  Follow the disposal regulations for the area where you live. Do not use any needle more than one time.  Throw away empty disposable pens in the regular trash. Questions to ask your health care provider  How often should I be taking insulin?  How often should I check my blood glucose?  What amount of insulin should I be taking at each time?  What are the side  effects?  What should I do if my blood glucose is too high?  What should I do if my blood glucose is too low?  What should I do if I forget to take my insulin?  What number should I call if I have questions? Where to find more information  American Diabetes Association (ADA): www.diabetes.org  American Association of Diabetes Educators (AADE) Patient Resources: https://www.diabeteseducator.org Summary  A subcutaneous injection is a shot of medicine that is injected into the layer of fat and tissue between skin and muscle.  Before you give yourself an insulin injection, be sure to test your blood sugar level (blood glucose level) and write down that number.  Check the expiration date and the type of insulin that is in the pen. The type of insulin that you take may determine how many injections you give yourself and when you need to give the injections.  It is best to inject insulin into the same body area each time (for example, always in the abdomen), but you should use a different spot in that area for each injection. This information is not intended to replace advice given to you by your health care provider. Make sure you discuss any questions you have with your health care provider. Document Released: 06/22/2015 Document Revised: 06/08/2017 Document Reviewed: 06/22/2015 Elsevier Interactive Patient Education  2019 [employer].

## 2018-07-06 NOTE — Progress Notes (Signed)
BP 113/67 (BP Location: Left Arm, Patient Position: Sitting, Cuff Size: Normal)   Pulse 85   Temp 97.7 F (36.5 C)   Ht 5\' 6"  (1.676 m)   Wt 190 lb 8 oz (86.4 kg)   SpO2 97%   BMI 30.75 kg/m    Subjective:    Patient ID: Kendra Wilson, female    DOB: 10/30/1970, 48 y.o.   MRN: [id number]  HPI: Kendra DuralJennifer Kue is a 48 y.o. female presenting on 07/06/2018 for Diabetes and Hyperlipidemia   HPI   Pt says she is feeling well.  She admits to eating more recently, including getting up and eating in the middle of the night.    Relevant past medical, surgical, family and social history reviewed and updated as indicated. Interim medical history since our last visit reviewed. Allergies and medications reviewed and updated.   Current Outpatient Medications:  .  colestipol (COLESTID) 1 g tablet, Take 1 tablet (1 g total) by mouth 3 (three) times daily as needed., Disp: 90 tablet, Rfl: 4 .  JANUVIA 100 MG tablet, TAKE 1 Tablet BY MOUTH ONCE DAILY, Disp: 90 tablet, Rfl: 0  Review of Systems  Constitutional: Positive for appetite change, diaphoresis and fatigue. Negative for chills, fever and unexpected weight change.  HENT: Negative for congestion, dental problem, drooling, ear pain, facial swelling, hearing loss, mouth sores, sneezing, sore throat, trouble swallowing and voice change.   Eyes: Negative for pain, discharge, redness, itching and visual disturbance.  Respiratory: Negative for cough, choking, shortness of breath and wheezing.   Cardiovascular: Negative for chest pain, palpitations and leg swelling.  Gastrointestinal: Positive for abdominal pain. Negative for blood in stool, constipation, diarrhea and vomiting.  Endocrine: Negative for cold intolerance, heat intolerance and polydipsia.  Genitourinary: Negative for decreased urine volume, dysuria and hematuria.  Musculoskeletal: Positive for arthralgias and back pain. Negative for gait problem.  Skin: Negative for rash.   Allergic/Immunologic: Negative for environmental allergies.  Neurological: Positive for headaches. Negative for seizures, syncope and light-headedness.  Hematological: Negative for adenopathy.  Psychiatric/Behavioral: Negative for agitation, dysphoric mood and suicidal ideas. The patient is not nervous/anxious.     Per HPI unless specifically indicated above     Objective:    BP 113/67 (BP Location: Left Arm, Patient Position: Sitting, Cuff Size: Normal)   Pulse 85   Temp 97.7 F (36.5 C)   Ht 5\' 6"  (1.676 m)   Wt 190 lb 8 oz (86.4 kg)   SpO2 97%   BMI 30.75 kg/m   Wt Readings from Last 3 Encounters:  07/06/18 190 lb 8 oz (86.4 kg)  04/05/18 188 lb (85.3 kg)  01/28/18 188 lb (85.3 kg)    Physical Exam Vitals signs reviewed.  Constitutional:      Appearance: She is well-developed.  HENT:     Head: Normocephalic and atraumatic.  Neck:     Musculoskeletal: Neck supple.  Cardiovascular:     Rate and Rhythm: Normal rate and regular rhythm.  Pulmonary:     Effort: Pulmonary effort is normal.     Breath sounds: Normal breath sounds.  Abdominal:     General: Bowel sounds are normal.     Palpations: Abdomen is soft. There is no mass.     Tenderness: There is no abdominal tenderness.  Lymphadenopathy:     Cervical: No cervical adenopathy.  Skin:    General: Skin is warm and dry.  Neurological:     Mental Status: She is alert and oriented to  person, place, and time.  Psychiatric:        Behavior: Behavior normal.     Results for orders placed or performed during the hospital encounter of 06/28/18  Lipid panel  Result Value Ref Range   Cholesterol 224 (H) 0 - 200 mg/dL   Triglycerides 768 (H) <150 mg/dL   HDL 45 >11 mg/dL   Total CHOL/HDL Ratio 5.0 RATIO   VLDL 39 0 - 40 mg/dL   LDL Cholesterol 572 (H) 0 - 99 mg/dL  Comprehensive metabolic panel  Result Value Ref Range   Sodium 136 135 - 145 mmol/L   Potassium 3.9 3.5 - 5.1 mmol/L   Chloride 104 98 - 111 mmol/L    CO2 24 22 - 32 mmol/L   Glucose, Bld 206 (H) 70 - 99 mg/dL   BUN 13 6 - 20 mg/dL   Creatinine, Ser 6.20 0.44 - 1.00 mg/dL   Calcium 8.7 (L) 8.9 - 10.3 mg/dL   Total Protein 7.4 6.5 - 8.1 g/dL   Albumin 3.6 3.5 - 5.0 g/dL   AST 31 15 - 41 U/L   ALT 42 0 - 44 U/L   Alkaline Phosphatase 123 38 - 126 U/L   Total Bilirubin 1.2 0.3 - 1.2 mg/dL   GFR calc non Af Amer >60 >60 mL/min   GFR calc Af Amer >60 >60 mL/min   Anion gap 8 5 - 15  Hemoglobin A1c  Result Value Ref Range   Hgb A1c MFr Bld 7.9 (H) 4.8 - 5.6 %   Mean Plasma Glucose 180.03 mg/dL      Assessment & Plan:    Encounter Diagnoses  Name Primary?  Marland Kitchen Uncontrolled type 2 diabetes mellitus with hyperglycemia (HCC) Yes  . 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 She is intolerant of statins -pt to continue [place].   Pt is intolerant of metformin so will start lantus 10units qhs.  Pt is taught how to self-administer.  She is to monitor bs and she is to call office for fbs < 70 or > 300 -pt counseled to watch diet -recommended increasing exercise -refer for annual DM eye exam -pt to follow up 1 month with bs log.  RTO sooner prn

## 2018-08-02 ENCOUNTER — Encounter: Payer: Self-pay | Admitting: Physician Assistant

## 2018-08-02 ENCOUNTER — Ambulatory Visit: Payer: Self-pay | Admitting: Physician Assistant

## 2018-08-02 ENCOUNTER — Other Ambulatory Visit: Payer: Self-pay | Admitting: Physician Assistant

## 2018-08-02 VITALS — BP 120/63 | HR 85 | Temp 97.7°F | Ht 66.0 in | Wt 193.5 lb

## 2018-08-02 DIAGNOSIS — Z1239 Encounter for other screening for malignant neoplasm of breast: Secondary | ICD-10-CM

## 2018-08-02 DIAGNOSIS — E669 Obesity, unspecified: Secondary | ICD-10-CM

## 2018-08-02 DIAGNOSIS — E1165 Type 2 diabetes mellitus with hyperglycemia: Secondary | ICD-10-CM

## 2018-08-02 DIAGNOSIS — E785 Hyperlipidemia, unspecified: Secondary | ICD-10-CM

## 2018-08-02 NOTE — Progress Notes (Signed)
BP 120/63 (BP Location: Right Arm, Patient Position: Sitting, Cuff Size: Normal)   Pulse 85   Temp 97.7 F (36.5 C)   Ht 5\' 6"  (1.676 m)   Wt 193 lb 8 oz (87.8 kg)   SpO2 98%   BMI 31.23 kg/m    Subjective:    Patient ID: Kendra Wilson, female    DOB: Oct 05, 1970, 48 y.o.   MRN: [id number]  HPI: Kendra Wilson is a 48 y.o. female presenting on 08/02/2018 for Diabetes and Anemia   HPI  Lantus insulin was added to pt medications at last OV on 07/06/18.  She says she is having no difficulties with that.  She has bs log that is improving and most recent readings around 130     Relevant past medical, surgical, family and social history reviewed and updated as indicated. Interim medical history since our last visit reviewed. Allergies and medications reviewed and updated.   Current Outpatient Medications:  .  colestipol (COLESTID) 1 g tablet, Take 2 tablets (2 g total) by mouth 2 (two) times daily. (Patient taking differently: Take 3 g by mouth 2 (two) times daily. ), Disp: 120 tablet, Rfl: 4 .  insulin glargine (LANTUS) 100 UNIT/ML injection, Inject 10 Units into the skin at bedtime., Disp: , Rfl:  .  JANUVIA 100 MG tablet, TAKE 1 Tablet BY MOUTH ONCE DAILY, Disp: 90 tablet, Rfl: 0   Review of Systems  Constitutional: Negative for appetite change, chills, diaphoresis, fatigue, fever and unexpected weight change.  HENT: Negative for congestion, dental problem, drooling, ear pain, facial swelling, hearing loss, mouth sores, sneezing, sore throat, trouble swallowing and voice change.   Eyes: Negative for pain, discharge, redness, itching and visual disturbance.  Respiratory: Negative for cough, choking, shortness of breath and wheezing.   Cardiovascular: Negative for chest pain, palpitations and leg swelling.  Gastrointestinal: Negative for abdominal pain, blood in stool, constipation, diarrhea and vomiting.  Endocrine: Negative for cold intolerance, heat intolerance and  polydipsia.  Genitourinary: Negative for decreased urine volume, dysuria and hematuria.  Musculoskeletal: Negative for arthralgias, back pain and gait problem.  Skin: Negative for rash.  Allergic/Immunologic: Negative for environmental allergies.  Neurological: Negative for seizures, syncope, light-headedness and headaches.  Hematological: Negative for adenopathy.  Psychiatric/Behavioral: Negative for agitation, dysphoric mood and suicidal ideas. The patient is not nervous/anxious.     Per HPI unless specifically indicated above     Objective:    BP 120/63 (BP Location: Right Arm, Patient Position: Sitting, Cuff Size: Normal)   Pulse 85   Temp 97.7 F (36.5 C)   Ht 5\' 6"  (1.676 m)   Wt 193 lb 8 oz (87.8 kg)   SpO2 98%   BMI 31.23 kg/m   Wt Readings from Last 3 Encounters:  08/02/18 193 lb 8 oz (87.8 kg)  07/06/18 190 lb 8 oz (86.4 kg)  04/05/18 188 lb (85.3 kg)    Physical Exam Vitals signs reviewed.  Constitutional:      Appearance: She is well-developed.  HENT:     Head: Normocephalic and atraumatic.  Neck:     Musculoskeletal: Neck supple.  Cardiovascular:     Rate and Rhythm: Normal rate and regular rhythm.  Pulmonary:     Effort: Pulmonary effort is normal.     Breath sounds: Normal breath sounds.  Abdominal:     General: Bowel sounds are normal.     Palpations: Abdomen is soft. There is no mass.     Tenderness: There is no  abdominal tenderness.  Lymphadenopathy:     Cervical: No cervical adenopathy.  Skin:    General: Skin is warm and dry.  Neurological:     Mental Status: She is alert and oriented to person, place, and time.  Psychiatric:        Behavior: Behavior normal.         Assessment & Plan:    Encounter Diagnoses  Name Primary?  Marland Kitchen Uncontrolled type 2 diabetes mellitus with hyperglycemia (HCC) Yes  . Hyperlipidemia, unspecified hyperlipidemia type   . Screening for breast cancer   . Obesity, unspecified classification, unspecified  obesity type, unspecified whether serious comorbidity present     -pt to continue current insulin and medications -will order screening Mammogram -Colon cancer screening to start 1 year from now when pt is 49 -PAP is UTD -pt to follow up 2 months.  RTO sooner prn

## 2018-08-11 ENCOUNTER — Other Ambulatory Visit: Payer: Self-pay

## 2018-08-11 ENCOUNTER — Ambulatory Visit (HOSPITAL_COMMUNITY)
Admission: RE | Admit: 2018-08-11 | Discharge: 2018-08-11 | Disposition: A | Discharge status: Home / Self Care | Payer: Self-pay | Source: Ambulatory Visit | Attending: Physician Assistant | Admitting: Physician Assistant

## 2018-08-11 DIAGNOSIS — Z1239 Encounter for other screening for malignant neoplasm of breast: Secondary | ICD-10-CM

## 2018-09-16 ENCOUNTER — Other Ambulatory Visit: Payer: Self-pay | Admitting: Physician Assistant

## 2018-10-04 ENCOUNTER — Encounter: Payer: Self-pay | Admitting: Physician Assistant

## 2018-10-04 ENCOUNTER — Ambulatory Visit: Payer: Self-pay | Admitting: Physician Assistant

## 2018-10-04 DIAGNOSIS — E785 Hyperlipidemia, unspecified: Secondary | ICD-10-CM

## 2018-10-04 DIAGNOSIS — E669 Obesity, unspecified: Secondary | ICD-10-CM

## 2018-10-04 DIAGNOSIS — E1165 Type 2 diabetes mellitus with hyperglycemia: Secondary | ICD-10-CM

## 2018-10-04 NOTE — Progress Notes (Signed)
   There were no vitals taken for this visit.   Subjective:    Patient ID: Kendra Wilson, female    DOB: 09-03-1970, 48 y.o.   MRN: [id number]  HPI: Kendra Wilson is a 48 y.o. female presenting on 10/04/2018 for No chief complaint on file.   HPI    This is a telemedicine appointment through [employer] due to coronavirus pandemic  I connected with  Kendra Wilson on 10/04/18 by a video enabled telemedicine application and verified that I am speaking with the correct person using two identifiers.   I discussed the limitations of evaluation and management by telemedicine. The patient expressed understanding and agreed to proceed.   Pt says she is feeling well but is tired a lot.  She says she is babysitting her grandkids a lot.  She is trying to stay home as much as possible and is wearing a mask when she goes to the store  She says her morning fbs running 147-190.  Lowest 127.  Highest 253.  She says she has been trying to watch what she eats and exercise some.     Relevant past medical, surgical, family and social history reviewed and updated as indicated. Interim medical history since our last visit reviewed. Allergies and medications reviewed and updated.   Current Outpatient Medications:  .  colestipol (COLESTID) 1 g tablet, Take 2 tablets (2 g total) by mouth 2 (two) times daily. (Patient taking differently: Take 3 g by mouth 2 (two) times daily. ), Disp: 120 tablet, Rfl: 4 .  insulin glargine (LANTUS) 100 UNIT/ML injection, Inject 10 Units into the skin at bedtime., Disp: , Rfl:  .  JANUVIA 100 MG tablet, TAKE 1 Tablet BY MOUTH ONCE DAILY, Disp: 90 tablet, Rfl: 0   Review of Systems  Per HPI unless specifically indicated above     Objective:    There were no vitals taken for this visit.  Wt Readings from Last 3 Encounters:  08/02/18 193 lb 8 oz (87.8 kg)  07/06/18 190 lb 8 oz (86.4 kg)  04/05/18 188 lb (85.3 kg)    Physical Exam Constitutional:      General:  She is not in acute distress.    Appearance: She is obese. She is not ill-appearing.  HENT:     Head: Normocephalic and atraumatic.  Pulmonary:     Effort: Pulmonary effort is normal. No respiratory distress.  Neurological:     Mental Status: She is alert and oriented to person, place, and time.  Psychiatric:        Attention and Perception: Attention normal.        Mood and Affect: Mood normal.        Speech: Speech normal.        Behavior: Behavior normal. Behavior is cooperative.           Assessment & Plan:    Encounter Diagnoses  Name Primary?  Marland Kitchen Uncontrolled type 2 diabetes mellitus with hyperglycemia (HCC) Yes  . Hyperlipidemia, unspecified hyperlipidemia type   . Obesity, unspecified classification, unspecified obesity type, unspecified whether serious comorbidity present     -will defer labs at this time due to CV19 -will Increase lantus to 15u.  Pt is to Monitor fbs.  She is to call office for fbs < 70 or > 300.  She is reminded to watch diabetic diet and exercise regularly. -pt will Follow up 3 months.  She is to contact office sooner prn

## 2018-11-08 ENCOUNTER — Other Ambulatory Visit: Payer: Self-pay | Admitting: Physician Assistant

## 2018-11-10 ENCOUNTER — Ambulatory Visit: Payer: Self-pay | Admitting: Physician Assistant

## 2018-11-10 ENCOUNTER — Other Ambulatory Visit: Payer: Self-pay

## 2018-11-10 ENCOUNTER — Encounter: Payer: Self-pay | Admitting: Physician Assistant

## 2018-11-10 VITALS — BP 120/80 | HR 103 | Temp 98.1°F | Wt 196.8 lb

## 2018-11-10 DIAGNOSIS — L739 Follicular disorder, unspecified: Secondary | ICD-10-CM

## 2018-11-10 MED ORDER — DOXYCYCLINE HYCLATE 100 MG PO TABS: 100.0000 mg | ORAL_TABLET | Freq: Two times a day (BID) | ORAL | 0 refills | Status: DC

## 2018-11-10 NOTE — Progress Notes (Signed)
   BP 120/80   Pulse (!) 103   Temp 98.1 F (36.7 C)   Wt 196 lb 12.8 oz (89.3 kg)   SpO2 95%   BMI 31.76 kg/m    Subjective:    Patient ID: Kendra Wilson, female    DOB: 1970-11-24, 48 y.o.   MRN: [id number]  HPI: Kendra Wilson is a 48 y.o. female presenting on 11/10/2018 for Rash (between R thigh and vagina. pt states she noticed what she thinks was a tick on Sunday morning (11-07-18). pt states she noticed a rash about on Monday 11-08-18. pt states rash is itchy and has a white spot in the middle of rash. pt has used neosporin and hydrocortisone cream pt states they help a little.)   HPI   Chief Complaint  Patient presents with  . Rash    between R thigh and vagina. pt states she noticed what she thinks was a tick on Sunday morning (11-07-18). pt states she noticed a rash about on Monday 11-08-18. pt states rash is itchy and has a white spot in the middle of rash. pt has used neosporin and hydrocortisone cream pt states they help a little.   Pt had negative screeningquestionnairefor CV19  Relevant past medical, surgical, family and social history reviewed and updated as indicated. Interim medical history since our last visit reviewed. Allergies and medications reviewed and updated.  Review of Systems  Per HPI unless specifically indicated above     Objective:    BP 120/80   Pulse (!) 103   Temp 98.1 F (36.7 C)   Wt 196 lb 12.8 oz (89.3 kg)   SpO2 95%   BMI 31.76 kg/m   Wt Readings from Last 3 Encounters:  11/10/18 196 lb 12.8 oz (89.3 kg)  08/02/18 193 lb 8 oz (87.8 kg)  07/06/18 190 lb 8 oz (86.4 kg)    Physical Exam Exam conducted with a chaperone present.  Constitutional:      General: She is not in acute distress.    Appearance: Normal appearance. She is not ill-appearing.  HENT:     Head: Normocephalic and atraumatic.  Pulmonary:     Effort: Pulmonary effort is normal. No respiratory distress.  Genitourinary:    Comments: Small area of mild redness-  no fluctuance, no purulence.  Hair shaved.  Area looks more like an infected follicle than a insect bite/sting.   (nurse Berenice assisted) Neurological:     Mental Status: She is alert.         Assessment & Plan:   Encounter Diagnosis  Name Primary?  . Folliculitis Yes    -rx doxycycline.  Recommended warm compresses or soaks. -counseled pt to avoid shaving as shaving increases chances for getting infected bumps  -Pt encouraged to wear mask when in public per recommendations of CDC to reduce risk of getting covid-19 virus  -pt to follow up as scheduled.   She is to contact office sooner if this place worsens or fails to resolve

## 2018-12-07 ENCOUNTER — Other Ambulatory Visit: Payer: Self-pay | Admitting: Physician Assistant

## 2019-01-02 ENCOUNTER — Other Ambulatory Visit: Payer: Self-pay | Admitting: Physician Assistant

## 2019-01-03 ENCOUNTER — Other Ambulatory Visit: Payer: Self-pay

## 2019-01-03 ENCOUNTER — Other Ambulatory Visit (HOSPITAL_COMMUNITY)
Admission: RE | Admit: 2019-01-03 | Discharge: 2019-01-03 | Disposition: A | Discharge status: Home / Self Care | Payer: Self-pay | Source: Ambulatory Visit | Attending: Physician Assistant | Admitting: Physician Assistant

## 2019-01-03 ENCOUNTER — Ambulatory Visit: Payer: Self-pay | Admitting: Physician Assistant

## 2019-01-03 DIAGNOSIS — E785 Hyperlipidemia, unspecified: Secondary | ICD-10-CM

## 2019-01-03 DIAGNOSIS — E1165 Type 2 diabetes mellitus with hyperglycemia: Secondary | ICD-10-CM

## 2019-01-03 LAB — COMPREHENSIVE METABOLIC PANEL
ALT: 38 U/L (ref 0–44)
AST: 31 U/L (ref 15–41)
Albumin: 3.6 g/dL (ref 3.5–5.0)
Alkaline Phosphatase: 103 U/L (ref 38–126)
Anion gap: 9 (ref 5–15)
BUN: 9 mg/dL (ref 6–20)
CO2: 25 mmol/L (ref 22–32)
Calcium: 8.8 mg/dL — ABNORMAL LOW (ref 8.9–10.3)
Chloride: 104 mmol/L (ref 98–111)
Creatinine, Ser: 0.78 mg/dL (ref 0.44–1.00)
GFR calc Af Amer: >60 mL/min (ref >60)
GFR calc non Af Amer: >60 mL/min (ref >60)
Glucose, Bld: 174 mg/dL — ABNORMAL HIGH (ref 70–99)
Potassium: 3.9 mmol/L (ref 3.5–5.1)
Sodium: 138 mmol/L (ref 135–145)
Total Bilirubin: 0.4 mg/dL (ref 0.3–1.2)
Total Protein: 7.3 g/dL (ref 6.5–8.1)

## 2019-01-03 LAB — LIPID PANEL
Cholesterol: 220 mg/dL — ABNORMAL HIGH (ref 0–200)
HDL: 41 mg/dL (ref >40)
LDL Cholesterol: 139 mg/dL — ABNORMAL HIGH (ref 0–99)
Total CHOL/HDL Ratio: 5.4 RATIO
Triglycerides: 201 mg/dL — ABNORMAL HIGH (ref <150)
VLDL: 40 mg/dL (ref 0–40)

## 2019-01-03 LAB — HEMOGLOBIN A1C
Hgb A1c MFr Bld: 8.2 % — ABNORMAL HIGH (ref 4.8–5.6)
Mean Plasma Glucose: 188.64 mg/dL

## 2019-01-05 ENCOUNTER — Encounter: Payer: Self-pay | Admitting: Physician Assistant

## 2019-01-05 ENCOUNTER — Ambulatory Visit: Payer: Self-pay | Admitting: Physician Assistant

## 2019-01-05 DIAGNOSIS — E785 Hyperlipidemia, unspecified: Secondary | ICD-10-CM

## 2019-01-05 DIAGNOSIS — E1165 Type 2 diabetes mellitus with hyperglycemia: Secondary | ICD-10-CM

## 2019-01-05 DIAGNOSIS — E669 Obesity, unspecified: Secondary | ICD-10-CM

## 2019-01-05 NOTE — Progress Notes (Signed)
There were no vitals taken for this visit.   Subjective:    Patient ID: Kendra Wilson, female    DOB: 1970-10-22, 48 y.o.   MRN: [id number]  HPI: Jamal Haskin is a 48 y.o. female presenting on 01/05/2019 for Diabetes and Hyperlipidemia   HPI   This is a telemedicine appointment through [employer] due to coronavirus pandemic  I connected with  Dareen Gutzwiller on 01/05/19 by a video enabled telemedicine application and verified that I am speaking with the correct person using two identifiers.   I discussed the limitations of evaluation and management by telemedicine. The patient expressed understanding and agreed to proceed.  Pt is at home.  provider is at office   She says she is doing well and she has no complaints today.  She has been checking her blood sugars at home.  fbs running 150-170.  She says she has not had any lows.   Relevant past medical, surgical, family and social history reviewed and updated as indicated. Interim medical history since our last visit reviewed. Allergies and medications reviewed and updated.   Current Outpatient Medications:  .  colestipol (COLESTID) 1 g tablet, TAKE 1 TABLET (1 G TOTAL) BY MOUTH 3 (THREE) TIMES DAILY., Disp: 90 tablet, Rfl: 0 .  insulin glargine (LANTUS) 100 UNIT/ML injection, Inject 15 Units into the skin at bedtime. , Disp: , Rfl:  .  JANUVIA 100 MG tablet, TAKE 1 Tablet BY MOUTH ONCE DAILY, Disp: 90 tablet, Rfl: 0    Review of Systems  Per HPI unless specifically indicated above     Objective:    There were no vitals taken for this visit.  Wt Readings from Last 3 Encounters:  11/10/18 196 lb 12.8 oz (89.3 kg)  08/02/18 193 lb 8 oz (87.8 kg)  07/06/18 190 lb 8 oz (86.4 kg)    Physical Exam Constitutional:      General: She is not in acute distress.    Appearance: Normal appearance. She is obese. She is not ill-appearing.  HENT:     Head: Normocephalic and atraumatic.  Pulmonary:     Effort: Pulmonary effort  is normal. No respiratory distress.  Neurological:     Mental Status: She is alert and oriented to person, place, and time.  Psychiatric:        Attention and Perception: Attention normal.        Speech: Speech normal.        Behavior: Behavior is cooperative.     Results for orders placed or performed during the hospital encounter of 01/03/19  Hemoglobin A1c  Result Value Ref Range   Hgb A1c MFr Bld 8.2 (H) 4.8 - 5.6 %   Mean Plasma Glucose 188.64 mg/dL  Lipid panel  Result Value Ref Range   Cholesterol 220 (H) 0 - 200 mg/dL   Triglycerides 201 (H) <150 mg/dL   HDL 41 >40 mg/dL   Total CHOL/HDL Ratio 5.4 RATIO   VLDL 40 0 - 40 mg/dL   LDL Cholesterol 139 (H) 0 - 99 mg/dL  Comprehensive metabolic panel  Result Value Ref Range   Sodium 138 135 - 145 mmol/L   Potassium 3.9 3.5 - 5.1 mmol/L   Chloride 104 98 - 111 mmol/L   CO2 25 22 - 32 mmol/L   Glucose, Bld 174 (H) 70 - 99 mg/dL   BUN 9 6 - 20 mg/dL   Creatinine, Ser 0.78 0.44 - 1.00 mg/dL   Calcium 8.8 (L) 8.9 - 10.3 mg/dL  Total Protein 7.3 6.5 - 8.1 g/dL   Albumin 3.6 3.5 - 5.0 g/dL   AST 31 15 - 41 U/L   ALT 38 0 - 44 U/L   Alkaline Phosphatase 103 38 - 126 U/L   Total Bilirubin 0.4 0.3 - 1.2 mg/dL   GFR calc non Af Amer >60 >60 mL/min   GFR calc Af Amer >60 >60 mL/min   Anion gap 9 5 - 15      Assessment & Plan:    Encounter Diagnoses  Name Primary?  Marland Kitchen. Uncontrolled type 2 diabetes mellitus with hyperglycemia (HCC) Yes  . Hyperlipidemia, unspecified hyperlipidemia type   . Obesity, unspecified classification, unspecified obesity type, unspecified whether serious comorbidity present      -reviewed labs with pt -lipids still too high but pt is intolerant of statins.  She will continue colestid and lowfat diet -pt to Increase lantus to 18units daily.  She is to continue to monitor blood sugars.  She is reminded to call office for fbs < 70 or > 300  -pt to follow up 3 months.  She is to contact office sooner  prn

## 2019-01-26 ENCOUNTER — Other Ambulatory Visit: Payer: Self-pay | Admitting: Physician Assistant

## 2019-02-22 ENCOUNTER — Other Ambulatory Visit: Payer: Self-pay | Admitting: Physician Assistant

## 2019-02-23 ENCOUNTER — Other Ambulatory Visit: Payer: Self-pay

## 2019-02-23 DIAGNOSIS — Z20822 Contact with and (suspected) exposure to covid-19: Secondary | ICD-10-CM

## 2019-02-25 LAB — NOVEL CORONAVIRUS, NAA: SARS-CoV-2, NAA: NOT DETECTED

## 2019-03-18 ENCOUNTER — Encounter: Payer: Self-pay | Admitting: Physician Assistant

## 2019-03-29 ENCOUNTER — Other Ambulatory Visit: Payer: Self-pay

## 2019-03-29 DIAGNOSIS — Z20822 Contact with and (suspected) exposure to covid-19: Secondary | ICD-10-CM

## 2019-03-31 ENCOUNTER — Telehealth: Payer: Self-pay | Admitting: Physician Assistant

## 2019-03-31 LAB — NOVEL CORONAVIRUS, NAA: SARS-CoV-2, NAA: DETECTED — AB

## 2019-03-31 NOTE — Telephone Encounter (Signed)
Kendra Wilson called with questions regarding covid 19 test.  Reviewed with patient that test result is positive.  Discussed that she needs to quarantine herself for 10 days.  Patient is not having any symptoms.  She was tested because she was around someone who was concerned about the virus.  Patient works in [employer] at [employer].  Patient is told she should not return to work.  She is told that she needs to not leave her home for the next 10 days.  She may contact her employer by telephone.  Discussed with patient that the public health department would be notified and she should be expecting a call in reference to her positive test.  Discussed with patient that her daughter has been in contact with her within the past several days and recommended that she her daughter get tested.  Discussed with patient if she develops symptoms she can contact us for a telemedicine visit.  If she has problems breathing, becomes confused, cyanotic or other issues she can seek treatment in the emergency department.  Notified public health nurse [name]) in reference to positive test result and fax the result to her.

## 2019-04-07 ENCOUNTER — Other Ambulatory Visit: Payer: Self-pay

## 2019-04-07 ENCOUNTER — Ambulatory Visit: Payer: Self-pay | Admitting: Physician Assistant

## 2019-04-07 DIAGNOSIS — Z20822 Contact with and (suspected) exposure to covid-19: Secondary | ICD-10-CM

## 2019-04-08 LAB — NOVEL CORONAVIRUS, NAA: SARS-CoV-2, NAA: NOT DETECTED

## 2019-04-11 LAB — HEMOGLOBIN A1C: Hemoglobin A1C: 8.1

## 2019-04-22 ENCOUNTER — Other Ambulatory Visit: Payer: Self-pay

## 2019-04-22 ENCOUNTER — Encounter: Payer: Self-pay | Admitting: "Endocrinology

## 2019-04-22 ENCOUNTER — Ambulatory Visit: Payer: BC Managed Care – PPO | Admitting: "Endocrinology

## 2019-04-22 VITALS — BP 113/77 | HR 84 | Temp 97.7°F | Ht 67.0 in | Wt 200.6 lb

## 2019-04-22 DIAGNOSIS — E782 Mixed hyperlipidemia: Secondary | ICD-10-CM

## 2019-04-22 DIAGNOSIS — E1165 Type 2 diabetes mellitus with hyperglycemia: Secondary | ICD-10-CM

## 2019-04-22 MED ORDER — ACCU-CHEK GUIDE VI STRP: ORAL_STRIP | 2 refills | Status: DC

## 2019-04-22 MED ORDER — ACCU-CHEK GUIDE W/DEVICE KIT: 1.0000 | PACK | 0 refills | Status: DC

## 2019-04-22 MED ORDER — SITAGLIPTIN PHOSPHATE 100 MG PO TABS: 100.0000 mg | ORAL_TABLET | Freq: Every day | ORAL | 0 refills | Status: DC

## 2019-04-22 MED ORDER — ROSUVASTATIN CALCIUM 5 MG PO TABS: 5.0000 mg | ORAL_TABLET | Freq: Every day | ORAL | 3 refills | Status: DC

## 2019-04-22 MED ORDER — BD PEN NEEDLE SHORT U/F 31G X 8 MM MISC: 1.0000 | 3 refills | Status: AC

## 2019-04-22 MED ORDER — TRESIBA FLEXTOUCH 100 UNIT/ML ~~LOC~~ SOPN: 24.0000 [IU] | PEN_INJECTOR | Freq: Every day | SUBCUTANEOUS | 2 refills | Status: DC

## 2019-04-22 NOTE — Patient Instructions (Signed)

## 2019-04-22 NOTE — Progress Notes (Signed)
Endocrinology Consult Note       04/22/2019, 11:42 AM   Subjective:    Patient ID: Kendra Wilson, female    DOB: 1970/11/08.  Kendra Wilson is being seen in consultation for management of currently uncontrolled symptomatic diabetes requested by  Sharilyn Sites, MD.   Past Medical History:  Diagnosis Date  . Diabetes mellitus without complication ([place])   . Diabetes mellitus, type II ([place])   . Hyperlipidemia   . Kidney stones 2018    Past Surgical History:  Procedure Laterality Date  . CARPAL TUNNEL RELEASE Left   . CHOLECYSTECTOMY    . LITHOTRIPSY  2018  . TUBAL LIGATION      Social History   Socioeconomic History  . Marital status: Single    Spouse name: Not on file  . Number of children: Not on file  . Years of education: Not on file  . Highest education level: Not on file  Occupational History  . Not on file  Social Needs  . Financial resource strain: Not on file  . Food insecurity    Worry: Not on file    Inability: Not on file  . Transportation needs    Medical: Not on file    Non-medical: Not on file  Tobacco Use  . Smoking status: Never Smoker  . Smokeless tobacco: Never Used  Substance and Sexual Activity  . Alcohol use: No    Frequency: Never  . Drug use: No  . Sexual activity: Yes    Birth control/protection: Surgical  Lifestyle  . Physical activity    Days per week: Not on file    Minutes per session: Not on file  . Stress: Not on file  Relationships  . Social Herbalist on phone: Not on file    Gets together: Not on file    Attends religious service: Not on file    Active member of club or organization: Not on file    Attends meetings of clubs or organizations: Not on file    Relationship status: Not on file  Other Topics Concern  . Not on file  Social History Narrative  . Not on file    History reviewed. No pertinent family  history.  Outpatient Encounter Medications as of 04/22/2019  Medication Sig  . Blood Glucose Monitoring Suppl (ACCU-CHEK GUIDE) w/Device KIT 1 Piece by Does not apply route as directed.  . colestipol (COLESTID) 1 g tablet TAKE 1 TABLET (1 G TOTAL) BY MOUTH 3 (THREE) TIMES DAILY.  Marland Kitchen glucose blood (ACCU-CHEK GUIDE) test strip Use as instructed  . insulin degludec (TRESIBA FLEXTOUCH) 100 UNIT/ML SOPN FlexTouch Pen Inject 0.24 mLs (24 Units total) into the skin at bedtime.  . Insulin Pen Needle (B-D ULTRAFINE III SHORT PEN) 31G X 8 MM MISC 1 each by Does not apply route as directed.  . rosuvastatin (CRESTOR) 5 MG tablet Take 1 tablet (5 mg total) by mouth daily.  . sitaGLIPtin (JANUVIA) 100 MG tablet Take 1 tablet (100 mg total) by mouth daily.  . [DISCONTINUED] insulin glargine (LANTUS) 100 UNIT/ML injection Inject 24 Units into the skin at bedtime.  . [  DISCONTINUED] JANUVIA 100 MG tablet TAKE 1 Tablet BY MOUTH ONCE DAILY   No facility-administered encounter medications on file as of 04/22/2019.     ALLERGIES: Allergies  Allergen Reactions  . Codeine   . Demerol [Meperidine]   . Morphine And Related   . Statins Other (See Comments)    Ankle edema/ankle swelling    VACCINATION STATUS:  There is no immunization history on file for this patient.  Diabetes She presents for her initial diabetic visit. She has type 2 diabetes mellitus. Onset time: She was diagnosed at approximate age of 10 years, after an episode of gestational diabetes with her third child. There are no hypoglycemic associated symptoms. Pertinent negatives for hypoglycemia include no confusion, headaches, pallor or seizures. Associated symptoms include fatigue, polydipsia and polyuria. Pertinent negatives for diabetes include no chest pain and no polyphagia. There are no hypoglycemic complications. Symptoms are stable. There are no diabetic complications. Risk factors for coronary artery disease include diabetes mellitus and  dyslipidemia. Current diabetic treatment includes insulin injections (She is currently on Lantus 20 units nightly, Januvia 100 mg daily.). Her weight is fluctuating minimally. She is following a generally unhealthy diet. When asked about meal planning, she reported none. She has not had a previous visit with a dietitian. She participates in exercise intermittently. Her breakfast blood glucose range is generally 140-180 mg/dl. Her overall blood glucose range is 140-180 mg/dl. (She brought a sheet of paper which shows fasting blood glucose between 100- 150 mg per DL.) An ACE inhibitor/angiotensin II receptor blocker is not being taken. Eye exam is current.  Hyperlipidemia This is a chronic problem. The current episode started more than 1 year ago. The problem is uncontrolled. Exacerbating diseases include diabetes. Pertinent negatives include no chest pain, myalgias or shortness of breath. Current antihyperlipidemic treatment includes bile acid squestrants. Risk factors for coronary artery disease include diabetes mellitus, dyslipidemia and hypertension.     Review of Systems  Constitutional: Positive for fatigue. Negative for chills, fever and unexpected weight change.  HENT: Negative for trouble swallowing and voice change.   Eyes: Negative for visual disturbance.  Respiratory: Negative for cough, shortness of breath and wheezing.   Cardiovascular: Negative for chest pain, palpitations and leg swelling.  Gastrointestinal: Negative for diarrhea, nausea and vomiting.  Endocrine: Positive for polydipsia and polyuria. Negative for cold intolerance, heat intolerance and polyphagia.  Musculoskeletal: Negative for arthralgias and myalgias.  Skin: Negative for color change, pallor, rash and wound.  Neurological: Negative for seizures and headaches.  Psychiatric/Behavioral: Negative for confusion and suicidal ideas.    Objective:    BP 113/77 (BP Location: Right Arm, Patient Position: Sitting, Cuff  Size: Normal)   Pulse 84   Temp 97.7 F (36.5 C) (Oral)   Ht '5\' 7"'  (1.702 m)   Wt 200 lb 9.6 oz (91 kg)   SpO2 100%   BMI 31.42 kg/m   Wt Readings from Last 3 Encounters:  04/22/19 200 lb 9.6 oz (91 kg)  11/10/18 196 lb 12.8 oz (89.3 kg)  08/02/18 193 lb 8 oz (87.8 kg)     Physical Exam Constitutional:      Appearance: She is well-developed.  HENT:     Head: Normocephalic and atraumatic.  Neck:     Musculoskeletal: Normal range of motion and neck supple.     Thyroid: No thyromegaly.     Trachea: No tracheal deviation.  Cardiovascular:     Rate and Rhythm: Normal rate.  Pulmonary:     Effort:  Pulmonary effort is normal.  Abdominal:     Tenderness: There is no abdominal tenderness. There is no guarding.  Musculoskeletal: Normal range of motion.  Skin:    General: Skin is warm and dry.     Coloration: Skin is not pale.     Findings: No erythema or rash.  Neurological:     Mental Status: She is alert and oriented to person, place, and time.     Cranial Nerves: No cranial nerve deficit.     Coordination: Coordination normal.     Deep Tendon Reflexes: Reflexes are normal and symmetric.  Psychiatric:        Judgment: Judgment normal.       CMP ( most recent) CMP     Component Value Date/Time   NA 138 01/03/2019 0818   K 3.9 01/03/2019 0818   CL 104 01/03/2019 0818   CO2 25 01/03/2019 0818   GLUCOSE 174 (H) 01/03/2019 0818   BUN 9 01/03/2019 0818   CREATININE 0.78 01/03/2019 0818   CALCIUM 8.8 (L) 01/03/2019 0818   PROT 7.3 01/03/2019 0818   ALBUMIN 3.6 01/03/2019 0818   AST 31 01/03/2019 0818   ALT 38 01/03/2019 0818   ALKPHOS 103 01/03/2019 0818   BILITOT 0.4 01/03/2019 0818   GFRNONAA >60 01/03/2019 0818   GFRAA >60 01/03/2019 0818     Diabetic Labs (most recent): Lab Results  Component Value Date   HGBA1C 8.1 04/11/2019   HGBA1C 8.2 (H) 01/03/2019   HGBA1C 7.9 (H) 06/28/2018     Lipid Panel ( most recent) Lipid Panel     Component Value  Date/Time   CHOL 220 (H) 01/03/2019 0818   TRIG 201 (H) 01/03/2019 0818   HDL 41 01/03/2019 0818   CHOLHDL 5.4 01/03/2019 0818   VLDL 40 01/03/2019 0818   LDLCALC 139 (H) 01/03/2019 0818       Assessment & Plan:   1. Uncontrolled type 2 diabetes mellitus with hyperglycemia (HCC)  - Samai Corea has currently uncontrolled symptomatic type 2 DM since  48 years of age,  with most recent A1c of 8.1 %. Recent labs reviewed. - I had a long discussion with her about the progressive nature of diabetes and the pathology behind its complications. -He does not report gross complications, however she remains at a high risk for more acute and chronic complications which include CAD, CVA, CKD, retinopathy, and neuropathy. These are all discussed in detail with her.  - I have counseled her on diet  and weight management  by adopting a carbohydrate restricted/protein rich diet. Patient is encouraged to switch to  unprocessed or minimally processed     complex starch and increased protein intake (animal or plant source), fruits, and vegetables. -  she is advised to stick to a routine mealtimes to eat 3 meals  a day and avoid unnecessary snacks ( to snack only to correct hypoglycemia).   - she admits that there is a room for improvement in her food and drink choices. - Suggestion is made for her to avoid simple carbohydrates  from her diet including Cakes, Sweet Desserts, Ice Cream, Soda (diet and regular), Sweet Tea, Candies, Chips, Cookies, Store Bought Juices, Alcohol in Excess of  1-2 drinks a day, Artificial Sweeteners,  Coffee Creamer, and "Sugar-free" Products. This will help patient to have more stable blood glucose profile and potentially avoid unintended weight gain.  - she will be scheduled with Jearld Fenton, RDN, CDE for diabetes education.  - I  have approached her with the following individualized plan to manage  her diabetes and patient agrees:   - she does not tolerate metformin,  will continue to need insulin treatment in order for her to achieve and maintain control of diabetes to target.    -Her insurance prefers Tyler Aas, advised to increase [place] to 24 units units daily at bedtime , associated with strict monitoring of glucose 4 times a day-before meals and at bedtime. - she is warned not to take insulin without proper monitoring per orders. - Adjustment parameters are given to her for hypo and hyperglycemia in writing. - she is encouraged to call clinic for blood glucose levels less than 70 or above 300 mg /dl. - she is advised to continue Januvia 100 mg p.o. daily at breakfast, therapeutically suitable for patient . -She does not tolerate metformin.  - Specific targets for  A1c;  LDL, HDL, Triglycerides, and  Waist Circumference were discussed with the patient.  2) Blood Pressure /Hypertension:  her blood pressure is  controlled to target.   she is not on any antihypertensive medications.    3) Lipids/Hyperlipidemia:   Review of her recent lipid panel showed uncontrolled  LDL at 139 .  She reports some intolerance to higher dose of statins in the past, however willing to try Crestor.  I discussed and prescribed Crestor 5 mg p.o. daily at bedtime while she continues colestipol 1 g p.o. 3 times daily AC.     Side effects and precautions discussed with her.  4)  Weight/Diet:  Body mass index is 31.42 kg/m.  -   clearly complicating her diabetes care.   she is  a candidate for weight loss. I discussed with her the fact that loss of 5 - 10% of her  current body weight will have the most impact on her diabetes management.  Exercise, and detailed carbohydrates information provided  -  detailed on discharge instructions.  5) Chronic Care/Health Maintenance:  -she  is on  Statin medications and  is encouraged to initiate and continue to follow up with Ophthalmology, Dentist,  Podiatrist at least yearly or according to recommendations, and advised to   stay away from  smoking. I have recommended yearly flu vaccine and pneumonia vaccine at least every 5 years; moderate intensity exercise for up to 150 minutes weekly; and  sleep for at least 7 hours a day.  - she is  advised to maintain close follow up with Sharilyn Sites, MD for primary care needs, as well as her other providers for optimal and coordinated care.  - Time spent with the patient: 60 minutes, of which >50% was spent in obtaining information about her symptoms, reviewing her previous labs/studies, evaluations, and treatments, counseling her about her currently uncontrolled type 2 diabetes, hyperlipidemia, and developing plans for long term treatment based on the latest standards of care/guidelines.  Please refer to " Patient Self Inventory" in the Media  tab for reviewed elements of pertinent patient history.  Keasia Soller participated in the discussions, expressed understanding, and voiced agreement with the above plans.  All questions were answered to her satisfaction. she is encouraged to contact clinic should she have any questions or concerns prior to her return visit.  Follow up plan: - Return in about 10 days (around 05/02/2019) for Follow up with Meter and Logs Only - no Labs.  Glade Lloyd, MD [facility] [address] [place], [place] [address] Phone: [phone]  Fax: [phone]  04/22/2019, 11:42 AM  This note was partially dictated with voice recognition software. Similar sounding words can be transcribed inadequately or may not  be corrected upon review.

## 2019-04-26 ENCOUNTER — Telehealth: Payer: Self-pay | Admitting: "Endocrinology

## 2019-04-26 MED ORDER — LANCETS THIN MISC: 1.0000 | Freq: Four times a day (QID) | 5 refills | Status: AC

## 2019-04-26 NOTE — Telephone Encounter (Signed)
Pt needs lancets called into [facility] [place] for one touch verio

## 2019-05-09 ENCOUNTER — Encounter: Payer: BC Managed Care – PPO | Admitting: Nutrition

## 2019-05-10 ENCOUNTER — Ambulatory Visit (INDEPENDENT_AMBULATORY_CARE_PROVIDER_SITE_OTHER): Payer: BC Managed Care – PPO | Admitting: "Endocrinology

## 2019-05-10 ENCOUNTER — Encounter: Payer: Self-pay | Admitting: "Endocrinology

## 2019-05-10 DIAGNOSIS — E782 Mixed hyperlipidemia: Secondary | ICD-10-CM | POA: Diagnosis not present

## 2019-05-10 DIAGNOSIS — E1165 Type 2 diabetes mellitus with hyperglycemia: Secondary | ICD-10-CM

## 2019-05-10 NOTE — Progress Notes (Signed)
05/10/2019, 1:39 PM                                                    Endocrinology Telehealth Visit Follow up Note -During COVID -19 Pandemic  This visit type was conducted due to national recommendations for restrictions regarding the COVID-19 Pandemic  in an effort to limit this patient's exposure and mitigate transmission of the corona virus.  Due to her co-morbid illnesses, Kendra Wilson is at  moderate to high risk for complications without adequate follow up.  This format is felt to be most appropriate for her at this time.  I connected with this patient on 05/10/2019   by telephone and verified that I am speaking with the correct person using two identifiers. Kendra Wilson, [birth year]. she has verbally consented to this visit. All issues noted in this document were discussed and addressed. The format was not optimal for physical exam.    Subjective:    Patient ID: Kendra Wilson, female    DOB: January 27, 1971.  Kendra Wilson is being engaged in telehealth via telephone  for management of currently uncontrolled symptomatic diabetes requested by  Sharilyn Sites, MD.   Past Medical History:  Diagnosis Date  . Diabetes mellitus without complication ([place])   . Diabetes mellitus, type II ([place])   . Hyperlipidemia   . Kidney stones 2018    Past Surgical History:  Procedure Laterality Date  . CARPAL TUNNEL RELEASE Left   . CHOLECYSTECTOMY    . LITHOTRIPSY  2018  . TUBAL LIGATION      Social History   Socioeconomic History  . Marital status: Single    Spouse name: Not on file  . Number of children: Not on file  . Years of education: Not on file  . Highest education level: Not on file  Occupational History  . Not on file  Social Needs  . Financial resource strain: Not on file  . Food insecurity    Worry: Not on file    Inability: Not on file  . Transportation needs    Medical: Not on file     Non-medical: Not on file  Tobacco Use  . Smoking status: Never Smoker  . Smokeless tobacco: Never Used  Substance and Sexual Activity  . Alcohol use: No    Frequency: Never  . Drug use: No  . Sexual activity: Yes    Birth control/protection: Surgical  Lifestyle  . Physical activity    Days per week: Not on file    Minutes per session: Not on file  . Stress: Not on file  Relationships  . Social Herbalist on phone: Not on file    Gets together: Not on file    Attends religious service: Not on file    Active member of club or organization: Not on file    Attends meetings of clubs or organizations: Not on file    Relationship status: Not on file  Other Topics Concern  .  Not on file  Social History Narrative  . Not on file    History reviewed. No pertinent family history.  Outpatient Encounter Medications as of 05/10/2019  Medication Sig  . Blood Glucose Monitoring Suppl (ACCU-CHEK GUIDE) w/Device KIT 1 Piece by Does not apply route as directed.  . colestipol (COLESTID) 1 g tablet TAKE 1 TABLET (1 G TOTAL) BY MOUTH 3 (THREE) TIMES DAILY.  Marland Kitchen glucose blood (ACCU-CHEK GUIDE) test strip Use as instructed  . insulin degludec (TRESIBA FLEXTOUCH) 100 UNIT/ML SOPN FlexTouch Pen Inject 0.24 mLs (24 Units total) into the skin at bedtime.  . Insulin Pen Needle (B-D ULTRAFINE III SHORT PEN) 31G X 8 MM MISC 1 each by Does not apply route as directed.  . Lancets Thin MISC 1 each by Other route 4 (four) times daily.  . rosuvastatin (CRESTOR) 5 MG tablet Take 1 tablet (5 mg total) by mouth daily.  . sitaGLIPtin (JANUVIA) 100 MG tablet Take 1 tablet (100 mg total) by mouth daily.   No facility-administered encounter medications on file as of 05/10/2019.     ALLERGIES: Allergies  Allergen Reactions  . Codeine   . Demerol [Meperidine]   . Morphine And Related   . Statins Other (See Comments)    Ankle edema/ankle swelling    VACCINATION STATUS:  There is no immunization  history on file for this patient.  Diabetes She presents for her follow-up diabetic visit. She has type 2 diabetes mellitus. Onset time: She was diagnosed at approximate age of 39 years, after an episode of gestational diabetes with her third child. Her disease course has been improving. There are no hypoglycemic associated symptoms. Pertinent negatives for hypoglycemia include no confusion, headaches, pallor or seizures. Associated symptoms include fatigue. Pertinent negatives for diabetes include no chest pain, no polydipsia, no polyphagia and no polyuria. There are no hypoglycemic complications. Symptoms are improving. There are no diabetic complications. Risk factors for coronary artery disease include diabetes mellitus and dyslipidemia. Current diabetic treatment includes insulin injections (She is currently on Lantus 20 units nightly, Januvia 100 mg daily.). Her weight is fluctuating minimally. She is following a generally unhealthy diet. When asked about meal planning, she reported none. She has not had a previous visit with a dietitian. She participates in exercise intermittently. Her breakfast blood glucose range is generally 130-140 mg/dl. Her lunch blood glucose range is generally 130-140 mg/dl. Her dinner blood glucose range is generally 130-140 mg/dl. Her overall blood glucose range is 130-140 mg/dl. (She reports near target glycemic profile between 100-150 since last visit.   ) An ACE inhibitor/angiotensin II receptor blocker is not being taken. Eye exam is current.  Hyperlipidemia This is a chronic problem. The current episode started more than 1 year ago. The problem is uncontrolled. Exacerbating diseases include diabetes. Pertinent negatives include no chest pain, myalgias or shortness of breath. Current antihyperlipidemic treatment includes bile acid squestrants. Risk factors for coronary artery disease include diabetes mellitus, dyslipidemia and hypertension.   Review of systems: Limited  as above    Objective:    There were no vitals taken for this visit.  Wt Readings from Last 3 Encounters:  04/22/19 200 lb 9.6 oz (91 kg)  11/10/18 196 lb 12.8 oz (89.3 kg)  08/02/18 193 lb 8 oz (87.8 kg)     CMP ( most recent) CMP     Component Value Date/Time   NA 138 01/03/2019 0818   K 3.9 01/03/2019 0818   CL 104 01/03/2019 0818   CO2  25 01/03/2019 0818   GLUCOSE 174 (H) 01/03/2019 0818   BUN 9 01/03/2019 0818   CREATININE 0.78 01/03/2019 0818   CALCIUM 8.8 (L) 01/03/2019 0818   PROT 7.3 01/03/2019 0818   ALBUMIN 3.6 01/03/2019 0818   AST 31 01/03/2019 0818   ALT 38 01/03/2019 0818   ALKPHOS 103 01/03/2019 0818   BILITOT 0.4 01/03/2019 0818   GFRNONAA >60 01/03/2019 0818   GFRAA >60 01/03/2019 0818     Diabetic Labs (most recent): Lab Results  Component Value Date   HGBA1C 8.1 04/11/2019   HGBA1C 8.2 (H) 01/03/2019   HGBA1C 7.9 (H) 06/28/2018     Lipid Panel ( most recent) Lipid Panel     Component Value Date/Time   CHOL 220 (H) 01/03/2019 0818   TRIG 201 (H) 01/03/2019 0818   HDL 41 01/03/2019 0818   CHOLHDL 5.4 01/03/2019 0818   VLDL 40 01/03/2019 0818   LDLCALC 139 (H) 01/03/2019 0818       Assessment & Plan:   1. Uncontrolled type 2 diabetes mellitus with hyperglycemia ([place])  - Kendra Wilson has currently uncontrolled symptomatic type 2 DM since  48 years of age. -She reports significantly improved glycemic profile, recent A1c of 8.1%.  -Recent labs reviewed. - I had a long discussion with her about the progressive nature of diabetes and the pathology behind its complications. -He does not report gross complications, however she remains at a high risk for more acute and chronic complications which include CAD, CVA, CKD, retinopathy, and neuropathy. These are all discussed in detail with her.  - I have counseled her on diet  and weight management  by adopting a carbohydrate restricted/protein rich diet. Patient is encouraged to switch  to  unprocessed or minimally processed     complex starch and increased protein intake (animal or plant source), fruits, and vegetables. -  she is advised to stick to a routine mealtimes to eat 3 meals  a day and avoid unnecessary snacks ( to snack only to correct hypoglycemia).   - she  admits there is a room for improvement in her diet and drink choices. -  Suggestion is made for her to avoid simple carbohydrates  from her diet including Cakes, Sweet Desserts / Pastries, Ice Cream, Soda (diet and regular), Sweet Tea, Candies, Chips, Cookies, Sweet Pastries,  Store Bought Juices, Alcohol in Excess of  1-2 drinks a day, Artificial Sweeteners, Coffee Creamer, and "Sugar-free" Products. This will help patient to have stable blood glucose profile and potentially avoid unintended weight gain.   - she will be scheduled with Kendra Wilson, RDN, CDE for diabetes education.  - I have approached her with the following individualized plan to manage  her diabetes and patient agrees:   - she does not tolerate metformin, she will continue to need insulin treatment in order for her to maintain control of diabetes to target.    -She is advised to continue Tresiba 24 units nightly, associated with monitoring of blood glucose 2 times a day-daily before breakfast and at bedtime.   - she is encouraged to call clinic for blood glucose levels less than 70 or above 200 mg /dl. - she is advised to continue Januvia 100 mg p.o. daily at breakfast, therapeutically suitable for patient . -She does not tolerate metformin.  - Specific targets for  A1c;  LDL, HDL, Triglycerides, and  Waist Circumference were discussed with the patient.  2) Blood Pressure /Hypertension:  she is advised to home monitor  blood pressure and report if > 140/90 on 2 separate readings.    she is not on any antihypertensive medications.    3) Lipids/Hyperlipidemia:   Review of her recent lipid panel showed uncontrolled  LDL at 139 .  She is  advised to continue Crestor 5 mg p.o. nightly.     Side effects and precautions discussed with her.  4)  Weight/Diet: Her BMI 31-    she is  a candidate for weight loss. I discussed with her the fact that loss of 5 - 10% of her  current body weight will have the most impact on her diabetes management.  Exercise, and detailed carbohydrates information provided  -  detailed on discharge instructions.  5) Chronic Care/Health Maintenance:  -she  is on  Statin medications and  is encouraged to initiate and continue to follow up with Ophthalmology, Dentist,  Podiatrist at least yearly or according to recommendations, and advised to   stay away from smoking. I have recommended yearly flu vaccine and pneumonia vaccine at least every 5 years; moderate intensity exercise for up to 150 minutes weekly; and  sleep for at least 7 hours a day.  - she is  advised to maintain close follow up with Sharilyn Sites, MD for primary care needs, as well as her other providers for optimal and coordinated care.  - Patient Care Time Today:  25 min, of which >50% was spent in  counseling and the rest reviewing her  current and  previous labs/studies, previous treatments, her blood glucose readings, and medications' doses and developing a plan for long-term care based on the latest recommendations for standards of care.   Kendra Wilson participated in the discussions, expressed understanding, and voiced agreement with the above plans.  All questions were answered to her satisfaction. she is encouraged to contact clinic should she have any questions or concerns prior to her return visit.   Follow up plan: - Return in about 3 months (around 08/08/2019) for Bring Meter and Logs- A1c in Office.  Glade Lloyd, MD [facility] [address] [place], [place] [address] Phone: [phone]  Fax: [phone]    05/10/2019, 1:39 PM  This note was partially dictated with voice  recognition software. Similar sounding words can be transcribed inadequately or may not  be corrected upon review.

## 2019-05-10 NOTE — Patient Instructions (Signed)

## 2019-05-21 ENCOUNTER — Other Ambulatory Visit: Payer: Self-pay | Admitting: Physician Assistant

## 2019-06-11 ENCOUNTER — Other Ambulatory Visit: Payer: Self-pay

## 2019-06-11 ENCOUNTER — Encounter (HOSPITAL_COMMUNITY): Payer: Self-pay | Admitting: *Deleted

## 2019-06-11 ENCOUNTER — Emergency Department (HOSPITAL_COMMUNITY)
Admission: EM | Admit: 2019-06-11 | Discharge: 2019-06-11 | Disposition: A | Discharge status: Home / Self Care | Payer: BC Managed Care – PPO | Attending: Emergency Medicine | Admitting: Emergency Medicine

## 2019-06-11 DIAGNOSIS — Z794 Long term (current) use of insulin: Secondary | ICD-10-CM | POA: Diagnosis not present

## 2019-06-11 DIAGNOSIS — E119 Type 2 diabetes mellitus without complications: Secondary | ICD-10-CM | POA: Insufficient documentation

## 2019-06-11 DIAGNOSIS — Z20822 Contact with and (suspected) exposure to covid-19: Secondary | ICD-10-CM | POA: Insufficient documentation

## 2019-06-11 DIAGNOSIS — J029 Acute pharyngitis, unspecified: Secondary | ICD-10-CM | POA: Insufficient documentation

## 2019-06-11 DIAGNOSIS — Z79899 Other long term (current) drug therapy: Secondary | ICD-10-CM | POA: Insufficient documentation

## 2019-06-11 LAB — BASIC METABOLIC PANEL
Anion gap: 9 (ref 5–15)
BUN: 14 mg/dL (ref 6–20)
CO2: 25 mmol/L (ref 22–32)
Calcium: 9.3 mg/dL (ref 8.9–10.3)
Chloride: 106 mmol/L (ref 98–111)
Creatinine, Ser: 0.72 mg/dL (ref 0.44–1.00)
GFR calc Af Amer: >60 mL/min (ref >60)
GFR calc non Af Amer: >60 mL/min (ref >60)
Glucose, Bld: 113 mg/dL — ABNORMAL HIGH (ref 70–99)
Potassium: 4.6 mmol/L (ref 3.5–5.1)
Sodium: 140 mmol/L (ref 135–145)

## 2019-06-11 LAB — CBC WITH DIFFERENTIAL/PLATELET
Abs Immature Granulocytes: 0.01 10*3/uL (ref 0.00–0.07)
Basophils Absolute: 0 10*3/uL (ref 0.0–0.1)
Basophils Relative: 0 %
Eosinophils Absolute: 0 10*3/uL (ref 0.0–0.5)
Eosinophils Relative: 1 %
HCT: 45.5 % (ref 36.0–46.0)
Hemoglobin: 15.2 g/dL — ABNORMAL HIGH (ref 12.0–15.0)
Immature Granulocytes: 0 %
Lymphocytes Relative: 29 %
Lymphs Abs: 1.7 10*3/uL (ref 0.7–4.0)
MCH: 31.2 pg (ref 26.0–34.0)
MCHC: 33.4 g/dL (ref 30.0–36.0)
MCV: 93.4 fL (ref 80.0–100.0)
Monocytes Absolute: 0.3 10*3/uL (ref 0.1–1.0)
Monocytes Relative: 5 %
Neutro Abs: 3.8 10*3/uL (ref 1.7–7.7)
Neutrophils Relative %: 65 %
Platelets: 251 10*3/uL (ref 150–400)
RBC: 4.87 MIL/uL (ref 3.87–5.11)
RDW: 11.6 % (ref 11.5–15.5)
WBC: 5.8 10*3/uL (ref 4.0–10.5)
nRBC: 0 % (ref 0.0–0.2)

## 2019-06-11 LAB — GROUP A STREP BY PCR: Group A Strep by PCR: NOT DETECTED

## 2019-06-11 NOTE — ED Notes (Signed)
Call to lab   Another 30 minutes for result

## 2019-06-11 NOTE — ED Notes (Signed)
Pt reports a "rash to the roof of my mouth"  Scratchy throat Runny nose; "well, my nose runs all of the time"  Took Claritin without relief   Here for eval   Advised of wait

## 2019-06-11 NOTE — Discharge Instructions (Addendum)
Rapid strep test was negative.  Labs without any significant abnormalities.  Very normal.  Make an appointment to follow-up with ear nose and throat.  In the meantime think about may be an irritant with your current mouthwash or toothpaste.  Return for any new or worse symptoms.  Okay to take Tylenol or Motrin as needed.

## 2019-06-11 NOTE — ED Notes (Signed)
Spec to lab

## 2019-06-11 NOTE — ED Triage Notes (Signed)
Pt c/o gum irritation ongoing for 3 days.  Also c/o throat scratchy, denies cough or runny nose.  Feeling fatigue for past 2 days.  Denies any contact with anyone with covid.

## 2019-06-11 NOTE — ED Notes (Signed)
Pt eating snacks without change in facial features

## 2019-06-11 NOTE — ED Provider Notes (Signed)
[facility] EMERGENCY DEPARTMENT Provider Note   CSN: [id number] Arrival date & time: 06/11/19  4403     History Chief Complaint  Patient presents with  . Fatigue  . Dental Pain    Kendra Wilson is a 49 y.o. female.  Patient with a complaint of a mild sore throat irritation of her soft palate back of her throat sort of scratchy no severe pain no cough or runny nose no fevers.  Feeling a little fatigued for the past 2 days.  Patient without any change in toothpaste or mouthwash.  She is noted a lot of redness to her soft palate area.        Past Medical History:  Diagnosis Date  . Diabetes mellitus without complication ([place])   . Diabetes mellitus, type II ([place])   . Hyperlipidemia   . Kidney stones 2018    Patient Active Problem List   Diagnosis Date Noted  . Type 2 diabetes mellitus with hyperglycemia ([place]) 01/28/2018  . Hyperlipidemia 01/28/2018  . Chronic diarrhea 01/28/2018  . 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 Presbyopia 08/19/2011    Past Surgical History:  Procedure Laterality Date  . CARPAL TUNNEL RELEASE Left   . CHOLECYSTECTOMY    . LITHOTRIPSY  2018  . TUBAL LIGATION       OB History   No obstetric history on file.     History reviewed. No pertinent family history.  Social History   Tobacco Use  . Smoking status: Never Smoker  . Smokeless tobacco: Never Used  Substance Use Topics  . Alcohol use: No  . Drug use: No    Home Medications Prior to Admission medications   Medication Sig Start Date End Date Taking? Authorizing Provider  Blood Glucose Monitoring Suppl (ACCU-CHEK GUIDE) w/Device KIT 1 Piece by Does not apply route as directed. 04/22/19   Cassandria Anger, MD  colestipol (COLESTID) 1 g tablet TAKE 1 TABLET (1 G TOTAL) BY MOUTH 3 (THREE) TIMES DAILY. 02/22/19   Soyla Dryer, PA-C  glucose blood (ACCU-CHEK GUIDE) test strip  Use as instructed 04/22/19   Cassandria Anger, MD  insulin degludec (TRESIBA FLEXTOUCH) 100 UNIT/ML SOPN FlexTouch Pen Inject 0.24 mLs (24 Units total) into the skin at bedtime. 04/22/19   Cassandria Anger, MD  Insulin Pen Needle (B-D ULTRAFINE III SHORT PEN) 31G X 8 MM MISC 1 each by Does not apply route as directed. 04/22/19   Cassandria Anger, MD  Lancets Thin MISC 1 each by Other route 4 (four) times daily. 04/26/19   Cassandria Anger, MD  rosuvastatin (CRESTOR) 5 MG tablet Take 1 tablet (5 mg total) by mouth daily. 04/22/19   Cassandria Anger, MD  sitaGLIPtin (JANUVIA) 100 MG tablet Take 1 tablet (100 mg total) by mouth daily. 04/22/19   Cassandria Anger, MD    Allergies    Codeine, Demerol [meperidine], Morphine and related, and Statins  Review of Systems   Review of Systems  Constitutional: Negative for chills and fever.  HENT: Positive for sore throat. Negative for congestion, rhinorrhea and trouble swallowing.   Eyes: Negative for visual disturbance.  Respiratory: Negative for cough and shortness of breath.   Cardiovascular: Negative for chest pain and leg swelling.  Gastrointestinal: Negative for abdominal pain, diarrhea, nausea and vomiting.  Genitourinary: Negative for dysuria.  Musculoskeletal: Negative for back pain and neck pain.  Skin: Negative for rash.  Neurological: Negative for dizziness, light-headedness and headaches.  Hematological: Does not bruise/bleed easily.  Psychiatric/Behavioral: Negative for confusion.    Physical Exam Updated Vital Signs BP 131/88 (BP Location: Right Arm)   Pulse (!) 107   Temp 98.4 F (36.9 C) (Oral)   Resp 16   Ht 1.702 m ('5\' 7"' )   Wt 88.5 kg   SpO2 100%   BMI 30.54 kg/m   Physical Exam Vitals and nursing note reviewed.  Constitutional:      General: She is not in acute distress.    Appearance: Normal appearance. She is well-developed.  HENT:     Head: Normocephalic and atraumatic.      Mouth/Throat:     Mouth: Mucous membranes are moist.     Comments: Erythema to the soft palate and the back of the throat no exudate.  Uvula midline no significant soft palate swelling.  Little bit of redness to the gums as well.  No floor the mouth swelling.  Tongue normal. Eyes:     Extraocular Movements: Extraocular movements intact.     Conjunctiva/sclera: Conjunctivae normal.     Pupils: Pupils are equal, round, and reactive to light.  Cardiovascular:     Rate and Rhythm: Normal rate and regular rhythm.     Heart sounds: No murmur.  Pulmonary:     Effort: Pulmonary effort is normal. No respiratory distress.     Breath sounds: Normal breath sounds.  Abdominal:     Palpations: Abdomen is soft.     Tenderness: There is no abdominal tenderness.  Musculoskeletal:        General: Normal range of motion.     Cervical back: Normal range of motion and neck supple.  Lymphadenopathy:     Cervical: No cervical adenopathy.  Skin:    General: Skin is warm and dry.     Capillary Refill: Capillary refill takes less than 2 seconds.  Neurological:     General: No focal deficit present.     Mental Status: She is alert and oriented to person, place, and time.     ED Results / Procedures / Treatments   Labs (all labs ordered are listed, but only abnormal results are displayed) Labs Reviewed  CBC WITH DIFFERENTIAL/PLATELET - Abnormal; Notable for the following components:      Result Value   Hemoglobin 15.2 (*)    All other components within normal limits  BASIC METABOLIC PANEL - Abnormal; Notable for the following components:   Glucose, Bld 113 (*)    All other components within normal limits  GROUP A STREP BY PCR    EKG None  Radiology No results found.  Procedures Procedures (including critical care time)  Medications Ordered in ED Medications - No data to display  ED Course  I have reviewed the triage vital signs and the nursing notes.  Pertinent labs & imaging results  that were available during my care of the patient were reviewed by me and considered in my medical decision making (see chart for details).    MDM Rules/Calculators/A&P                      Patient's clinical findings more consistent with some kind of direct irritant like an allergic reaction to toothpaste or mouthwash.  Patient states there is no changes.  Perhaps she has developed allergic reaction.  Basic labs were done CBC with with differential was normal basic metabolic panel was normal rapid strep negative.  Will refer  patient to ear nose and throat for follow-up.  Patient nontoxic no acute distress.  Based on the fact not exactly sure what the causes do not see a need for Benadryl or anything like that at this time.  Final Clinical Impression(s) / ED Diagnoses Final diagnoses:  Sore throat    Rx / DC Orders ED Discharge Orders    None       Fredia Sorrow, MD 06/11/19 1351

## 2019-06-11 NOTE — ED Notes (Signed)
Dr Herma Carson in to eval

## 2019-06-12 LAB — NOVEL CORONAVIRUS, NAA (HOSP ORDER, SEND-OUT TO REF LAB; TAT 18-24 HRS): SARS-CoV-2, NAA: NOT DETECTED

## 2019-06-13 ENCOUNTER — Encounter: Payer: BC Managed Care – PPO | Attending: "Endocrinology | Admitting: Nutrition

## 2019-06-13 ENCOUNTER — Other Ambulatory Visit: Payer: BC Managed Care – PPO

## 2019-06-13 ENCOUNTER — Other Ambulatory Visit: Payer: Self-pay

## 2019-06-13 DIAGNOSIS — E782 Mixed hyperlipidemia: Secondary | ICD-10-CM | POA: Diagnosis present

## 2019-06-13 DIAGNOSIS — E1165 Type 2 diabetes mellitus with hyperglycemia: Secondary | ICD-10-CM | POA: Diagnosis not present

## 2019-06-13 DIAGNOSIS — E669 Obesity, unspecified: Secondary | ICD-10-CM | POA: Insufficient documentation

## 2019-06-13 NOTE — Progress Notes (Signed)
  Medical Nutrition Therapy:  Appt start time: 1600 end time:  1700.  Assessment:  Primary concerns today: DIabetes Type 2, Lives by herself. On Januvia and 24 units of Tresiba.. DM x 30 yrs.   FBS 103, 108, 103, 129, 134 mg/dl Bedtime: 408, 144, 818, 184 and 146 mg/dl. Exercise;walks 3 times and walks 3-4 miles at a time. A1C 8.1%.  Lab Results  Component Value Date   HGBA1C 8.1 04/11/2019   CMP Latest Ref Rng & Units 06/11/2019 01/03/2019 06/28/2018  Glucose 70 - 99 mg/dL 563(J) 497(W) 263(Z)  BUN 6 - 20 mg/dL 14 9 13   Creatinine 0.44 - 1.00 mg/dL 8.58 8.50  Sodium 135 - 145 mmol/L 140 138 136  Potassium 3.5 - 5.1 mmol/L 4.6 3.9 3.9  Chloride 98 - 111 mmol/L 106 104 104  CO2 22 - 32 mmol/L 25 25 24   Calcium 8.9 - 10.3 mg/dL 9.3 2.77) )  Total Protein 6.5 - 8.1 g/dL - 7.3 7.4  Total Bilirubin 0.3 - 1.2 mg/dL - 0.4 1.2  Alkaline Phos 38 - 126 U/L - 103 123  AST 15 - 41 U/L - 31 31  ALT 0 - 44 U/L - 38 42   Lipid Panel     Component Value Date/Time   CHOL 220 (H) 01/03/2019 0818   TRIG 201 (H) 01/03/2019 0818   HDL 41 01/03/2019 0818   CHOLHDL 5.4 01/03/2019 0818   VLDL 40 01/03/2019 0818   LDLCALC 139 (H) 01/03/2019 0818   Vitals with BMI 06/11/2019  Height 5\' 7"   Weight 195 lbs  BMI 30.53  Systolic 131  Diastolic 88  Pulse 107   Learning Readiness:  Ready  Change in progress   MEDICATIONS:   DIETARY INTAKE:    24-hr recall:  B ( AM):  1 packet  Of oatmeal., PB, Snk ( AM):  L ( PM) Grilled chicken with lemon and brocolli, Snk ( PM):  D ( PM): chicken, tomatoes and vegetables.  Snk ( PM):  Beverages: water  Usual physical activity: Walking  Estimated energy needs: 1500  calories 170 g carbohydrates 112 g protein 42 g fat  Progress Towards Goal(s):  In progress.   Nutritional Diagnosis:  NB-1.1 Food and nutrition-related knowledge deficit As related to Diabetes.  As evidenced by A1C 8.1%.    Intervention:  Nutrition and Diabetes  education provided on My Plate, CHO counting, meal planning, portion sizes, timing of meals, avoiding snacks between meals unless having a low blood sugar, target ranges for A1C and blood sugars, signs/symptoms and treatment of hyper/hypoglycemia, monitoring blood sugars, taking medications as prescribed, benefits of exercising 30 minutes per day and prevention of complications of DM. Low Cholesterol diet.  Goals Follow MY Plate Eat 03/05/2019 grams of carbs per meal Eat three meals per day Do not skip meals Take meds as prescribed Increase fresh fruits and vegetables Drink only water Keep exercising Get A1C down to 7%  Teaching Method Utilized:  Visual Auditory Hands on  Handouts given during visit include:  The Plate Method   Meal Plan Card  Diabetes Instructions  Barriers to learning/adherence to lifestyle change: Done  Demonstrated degree of understanding via:  Teach Back   Monitoring/Evaluation:  Dietary intake, exercise, , and body weight in 1 month(s).

## 2019-06-16 ENCOUNTER — Other Ambulatory Visit: Payer: Self-pay

## 2019-06-16 ENCOUNTER — Ambulatory Visit: Payer: BC Managed Care – PPO | Attending: Internal Medicine

## 2019-06-16 DIAGNOSIS — Z20822 Contact with and (suspected) exposure to covid-19: Secondary | ICD-10-CM

## 2019-06-18 LAB — NOVEL CORONAVIRUS, NAA: SARS-CoV-2, NAA: NOT DETECTED

## 2019-06-22 ENCOUNTER — Encounter: Payer: Self-pay | Admitting: Nutrition

## 2019-06-22 NOTE — Patient Instructions (Signed)
Goals Follow MY Plate Eat 31-49 grams of carbs per meal Eat three meals per day Do not skip meals Take meds as prescribed Increase fresh fruits and vegetables Drink only water Keep exercising Get A1C down to 7%

## 2019-07-13 ENCOUNTER — Ambulatory Visit: Payer: BC Managed Care – PPO | Attending: Internal Medicine

## 2019-07-13 ENCOUNTER — Other Ambulatory Visit: Payer: Self-pay

## 2019-07-13 DIAGNOSIS — Z20822 Contact with and (suspected) exposure to covid-19: Secondary | ICD-10-CM

## 2019-07-14 LAB — NOVEL CORONAVIRUS, NAA: SARS-CoV-2, NAA: NOT DETECTED

## 2019-07-16 ENCOUNTER — Other Ambulatory Visit: Payer: Self-pay | Admitting: "Endocrinology

## 2019-07-17 ENCOUNTER — Other Ambulatory Visit: Payer: Self-pay | Admitting: "Endocrinology

## 2019-08-07 ENCOUNTER — Ambulatory Visit: Payer: BC Managed Care – PPO

## 2019-08-09 ENCOUNTER — Ambulatory Visit: Payer: BC Managed Care – PPO | Admitting: "Endocrinology

## 2019-08-09 ENCOUNTER — Ambulatory Visit: Payer: BC Managed Care – PPO | Admitting: Nutrition

## 2019-08-31 ENCOUNTER — Other Ambulatory Visit: Payer: Self-pay | Admitting: "Endocrinology

## 2019-09-12 ENCOUNTER — Other Ambulatory Visit (HOSPITAL_COMMUNITY): Payer: Self-pay | Admitting: Family Medicine

## 2019-09-12 DIAGNOSIS — Z1231 Encounter for screening mammogram for malignant neoplasm of breast: Secondary | ICD-10-CM

## 2019-09-19 ENCOUNTER — Ambulatory Visit (HOSPITAL_COMMUNITY)
Admission: RE | Admit: 2019-09-19 | Discharge: 2019-09-19 | Disposition: A | Discharge status: Home / Self Care | Payer: BC Managed Care – PPO | Source: Ambulatory Visit | Attending: Family Medicine | Admitting: Family Medicine

## 2019-09-19 ENCOUNTER — Other Ambulatory Visit: Payer: Self-pay

## 2019-09-19 DIAGNOSIS — Z1231 Encounter for screening mammogram for malignant neoplasm of breast: Secondary | ICD-10-CM | POA: Diagnosis not present

## 2019-09-21 ENCOUNTER — Encounter: Payer: Self-pay | Admitting: *Deleted

## 2019-10-25 ENCOUNTER — Other Ambulatory Visit: Payer: BC Managed Care – PPO

## 2019-12-20 ENCOUNTER — Ambulatory Visit: Payer: BC Managed Care – PPO

## 2019-12-28 ENCOUNTER — Ambulatory Visit (INDEPENDENT_AMBULATORY_CARE_PROVIDER_SITE_OTHER): Payer: Self-pay | Admitting: *Deleted

## 2019-12-28 ENCOUNTER — Other Ambulatory Visit: Payer: Self-pay

## 2019-12-28 ENCOUNTER — Encounter: Payer: Self-pay | Admitting: Internal Medicine

## 2019-12-28 VITALS — Ht 67.0 in | Wt 182.4 lb

## 2019-12-28 DIAGNOSIS — Z1211 Encounter for screening for malignant neoplasm of colon: Secondary | ICD-10-CM

## 2019-12-28 NOTE — Progress Notes (Signed)
FYI to [facility]: Pt has a demerol allergy. She passes out.

## 2019-12-28 NOTE — Progress Notes (Addendum)
Gastroenterology Pre-Procedure Review  Request Date: 12/28/2019 Requesting Physician: Rowan Blase, PA-C @ [facility], Last TCS 10 years ago 04/29/2010 done by Dr. Wyona Almas, benign hyperplastic polyp  PATIENT REVIEW QUESTIONS: The patient responded to the following health history questions as indicated:    1. Diabetes Melitis: yes 2. Joint replacements in the past 12 months: no 3. Major health problems in the past 3 months: no 4. Has an artificial valve or MVP: no 5. Has a defibrillator: no 6. Has been advised in past to take antibiotics in advance of a procedure like teeth cleaning: no 7. Family history of colon cancer: no  8. Alcohol Use: no 9. Illicit drug Use: no 10. History of sleep apnea: no  11. History of coronary artery or other vascular stents placed within the last 12 months: no 12. History of any prior anesthesia complications: yes, n/v, passes out with Demerol, vomits with Morphine 13. Body mass index is 28.57 kg/m.    MEDICATIONS & ALLERGIES:    Patient reports the following regarding taking any blood thinners:   Plavix? no Aspirin? no Coumadin? no Brilinta? no Xarelto? no Eliquis? no Pradaxa? no Savaysa? no Effient? no  Patient confirms/reports the following medications:  Current Outpatient Medications  Medication Sig Dispense Refill  . Blood Glucose Monitoring Suppl (ACCU-CHEK GUIDE) w/Device KIT 1 Piece by Does not apply route as directed. 1 kit 0  . colestipol (COLESTID) 1 g tablet TAKE 1 TABLET (1 G TOTAL) BY MOUTH 3 (THREE) TIMES DAILY. 90 tablet 2  . glucose blood (ONETOUCH VERIO) test strip Use as directed to check blood glucose two times daily 200 strip 2  . Insulin Pen Needle (B-D ULTRAFINE III SHORT PEN) 31G X 8 MM MISC 1 each by Does not apply route as directed. 100 each 3  . Lancets Thin MISC 1 each by Other route 4 (four) times daily. (Patient taking differently: 1 each by Other route in the morning and at bedtime. ) 150 each 5  . VICTOZA 18 MG/3ML SOPN  Inject 1.8 mg into the skin daily.    . insulin degludec (TRESIBA FLEXTOUCH) 100 UNIT/ML SOPN FlexTouch Pen Inject 0.24 mLs (24 Units total) into the skin at bedtime. (Patient not taking: Reported on 12/28/2019) 5 pen 2  . JANUVIA 100 MG tablet TAKE 1 TABLET BY MOUTH EVERY DAY (Patient not taking: Reported on 12/28/2019) 90 tablet 0  . rosuvastatin (CRESTOR) 5 MG tablet TAKE 1 TABLET BY MOUTH EVERY DAY (Patient not taking: Reported on 12/28/2019) 90 tablet 1   No current facility-administered medications for this visit.    Patient confirms/reports the following allergies:  Allergies  Allergen Reactions  . Codeine   . Demerol [Meperidine]     Passes out  . Morphine And Related   . Statins Other (See Comments)    Ankle edema/ankle swelling    No orders of the defined types were placed in this encounter.   AUTHORIZATION INFORMATION Primary Insurance: BCBS [place]  [place] #:[id number],  Group #: [id number] Pre-Cert / Josem Kaufmann required: No, not required  SCHEDULE INFORMATION: Procedure has been scheduled as follows:  Date: 01/30/2020, Time: 8:15 Location: [facility] with Dr. Abbey Chatters  This Gastroenterology Pre-Precedure Review Form is being routed to the following provider(s): Aliene Altes, PA

## 2019-12-28 NOTE — Progress Notes (Signed)
Ok to proceed with scheduling colonoscopy.  ASA II  Diabetes medication adjustments 1 day prior to procedure: One half dose of Victoza (0.9 units), one half dose Tresiba (12 units), one half dose Januvia (50 mg) Day of procedure: Hold morning diabetes medications.

## 2019-12-29 ENCOUNTER — Encounter: Payer: Self-pay | Admitting: *Deleted

## 2019-12-29 MED ORDER — PEG 3350-KCL-NA BICARB-NACL 420 G PO SOLR: 4000.0000 mL | Freq: Once | ORAL | 0 refills | Status: AC

## 2019-12-29 NOTE — Progress Notes (Signed)
Mailed pt diabetes medication adjustments.

## 2019-12-29 NOTE — Addendum Note (Signed)
Addended by: Noreene Larsson on: 12/29/2019 03:52 PM   Modules accepted: Orders

## 2019-12-29 NOTE — Patient Instructions (Signed)
Kendra Wilson   [birth year] MRN: [id number]    Procedure Date: 01/30/2020 Time to register: 6:45 am Place to register: [facility] Procedure Time: 8:15 am  Scheduled provider: Dr. Abbey Chatters  PREPARATION FOR COLONOSCOPY WITH TRI-LYTE SPLIT PREP  Please notify us immediately if you are diabetic, take iron supplements, or if you are on Coumadin or any other blood thinners.   Please hold the following medications:  See letter  You will need to purchase 1 fleet enema and 1 box of Bisacodyl 43m tablets.   2 DAYS BEFORE PROCEDURE:  DATE: 01/28/2020   DAY: Saturday Begin clear liquid diet AFTER your lunch meal. NO SOLID FOODS after this point.  1 DAY BEFORE PROCEDURE:  DATE: 01/29/2020   DAY: Sunday Continue clear liquids the entire day - NO SOLID FOOD.   Diabetic medications adjustments for today: See letter  At 2:00 pm:  Take 2 Bisacodyl tablets.   At 4:00pm:  Start drinking your solution. Make sure you mix well per instructions on the bottle. Try to drink 1 (one) 8 ounce glass every 10-15 minutes until you have consumed HALF the jug. You should complete by 6:00pm.You must keep the left over solution refrigerated until completed next day.  Continue clear liquids. You must drink plenty of clear liquids to prevent dehyration and kidney failure.     DAY OF PROCEDURE:   DATE: 01/30/2020   DAY: Monday If you take medications for your heart, blood pressure or breathing, you may take these medications.  Diabetic medications adjustments for today: See letter  Five hours before your procedure time @ 3:15 am:  Finish remaining amout of bowel prep, drinking 1 (one) 8 ounce glass every 10-15 minutes until complete. You have two hours to consume remaining prep.   Three hours before your procedure time @ 5:15 am:  Nothing by mouth.   At least one hour before going to the hospital:  Give yourself one Fleet enema. You may take your morning medications with sip of water unless we have  instructed otherwise.      Please see below for Dietary Information.  CLEAR LIQUIDS INCLUDE:  Water Jello (NOT red in color)   Ice Popsicles (NOT red in color)   Tea (sugar ok, no milk/cream) Powdered fruit flavored drinks  Coffee (sugar ok, no milk/cream) Gatorade/ Lemonade/ Kool-Aid  (NOT red in color)   Juice: apple, white grape, white cranberry Soft drinks  Clear bullion, consomme, broth (fat free beef/chicken/vegetable)  Carbonated beverages (any kind)  Strained chicken noodle soup Hard Candy   Remember: Clear liquids are liquids that will allow you to see your fingers on the other side of a clear glass. Be sure liquids are NOT red in color, and not cloudy, but CLEAR.  DO NOT EAT OR DRINK ANY OF THE FOLLOWING:  Dairy products of any kind   Cranberry juice Tomato juice / V8 juice   Grapefruit juice Orange juice     Red grape juice  Do not eat any solid foods, including such foods as: cereal, oatmeal, yogurt, fruits, vegetables, creamed soups, eggs, bread, crackers, pureed foods in a blender, etc.   HELPFUL HINTS FOR DRINKING PREP SOLUTION:   Make sure prep is extremely cold. Mix and refrigerate the the morning of the prep. You may also put in the freezer.   You may try mixing some Crystal Light or Country Time Lemonade if you prefer. Mix in small amounts; add more if necessary.  Try drinking through a straw  Rinse mouth with water or a mouthwash between glasses, to remove after-taste.  Try sipping on a cold beverage /ice/ popsicles between glasses of prep.  Place a piece of sugar-free hard candy in mouth between glasses.  If you become nauseated, try consuming smaller amounts, or stretch out the time between glasses. Stop for 30-60 minutes, then slowly start back drinking.        OTHER INSTRUCTIONS  You will need a responsible adult at least 50 years of age to accompany you and drive you home. This person must remain in the waiting room during your procedure. The  hospital will cancel your procedure if you do not have a responsible adult with you.   1. Wear loose fitting clothing that is easily removed. 2. Leave jewelry and other valuables at home.  3. Remove all body piercing jewelry and leave at home. 4. Total time from sign-in until discharge is approximately 2-3 hours. 5. You should go home directly after your procedure and rest. You can resume normal activities the day after your procedure. 6. The day of your procedure you should not:  Drive  Make legal decisions  Operate machinery  Drink alcohol  Return to work   You may call the office (Dept: [phone]) before 5:00pm, or page the doctor on call [phone]) after 5:00pm, for further instructions, if necessary.   Insurance Information YOU WILL NEED TO CHECK WITH YOUR INSURANCE COMPANY FOR THE BENEFITS OF COVERAGE YOU HAVE FOR THIS PROCEDURE.  UNFORTUNATELY, NOT ALL INSURANCE COMPANIES HAVE BENEFITS TO COVER ALL OR PART OF THESE TYPES OF PROCEDURES.  IT IS YOUR RESPONSIBILITY TO CHECK YOUR BENEFITS, HOWEVER, WE WILL BE GLAD TO ASSIST YOU WITH ANY CODES YOUR INSURANCE COMPANY MAY NEED.    PLEASE NOTE THAT MOST INSURANCE COMPANIES WILL NOT COVER A SCREENING COLONOSCOPY FOR PEOPLE UNDER THE AGE OF 50  IF YOU HAVE BCBS INSURANCE, YOU MAY HAVE BENEFITS FOR A SCREENING COLONOSCOPY BUT IF POLYPS ARE FOUND THE DIAGNOSIS WILL CHANGE AND THEN YOU MAY HAVE A DEDUCTIBLE THAT WILL NEED TO BE MET. SO PLEASE MAKE SURE YOU CHECK YOUR BENEFITS FOR A SCREENING COLONOSCOPY AS WELL AS A DIAGNOSTIC COLONOSCOPY.

## 2020-01-09 ENCOUNTER — Other Ambulatory Visit: Payer: Self-pay | Admitting: *Deleted

## 2020-01-09 MED ORDER — PEG 3350-KCL-NA BICARB-NACL 420 G PO SOLR: 4000.0000 mL | Freq: Once | ORAL | 0 refills | Status: AC

## 2020-01-09 NOTE — Progress Notes (Addendum)
Kendra Memos, PA: Pt said she is only taking Victoza.  She said she takes 1.8 at bedtime.  She wants to know if it is safe an how much to take.  She said she could only take 0.6 or 1.2 or 1.8.  She said that she can't do 0.9.  She said that the pen won't dispense.

## 2020-01-09 NOTE — Progress Notes (Signed)
1.2 units of Victoza the day before procedure should be fine. She can continue to monitor her blood sugars and correct any low sugars with sugary clear liquids.

## 2020-01-09 NOTE — Progress Notes (Signed)
Pt called in and requested me to send RX for prep kit to [facility].  She said that [facility] did not have it and it had been on back order for about 6 months.  Called [facility] and verified that they do have it in stock.  Sent RX and informed pt.

## 2020-01-10 ENCOUNTER — Encounter: Payer: Self-pay | Admitting: *Deleted

## 2020-01-10 NOTE — Progress Notes (Signed)
Mailed new diabetes adjustment letter to pt.

## 2020-01-13 NOTE — Patient Instructions (Signed)
Your procedure is scheduled on: 01/30/2020  Report to [name] at 6:45    AM.  Call this number if you have problems the morning of surgery: [phone]   Remember:              Follow Directions on the letter you received from Your Physician's office regarding the Bowel Prep              No Smoking the day of Procedure :   Take these medicines the morning of surgery with A SIP OF WATER: none               Take only 1/2 dose of Tresiba 12 units night before procedure.  No diabetic medication morning of procedure   Do not wear jewelry, make-up or nail polish.    Do not bring valuables to the hospital.  Contacts, dentures or bridgework may not be worn into surgery.  .   Patients discharged the day of surgery will not be allowed to drive home.     Colonoscopy, Adult, Care After This sheet gives you information about how to care for yourself after your procedure. Your health care provider may also give you more specific instructions. If you have problems or questions, contact your health care provider. What can I expect after the procedure? After the procedure, it is common to have:  A small amount of blood in your stool for 24 hours after the procedure.  Some gas.  Mild abdominal cramping or bloating.  Follow these instructions at home: General instructions   For the first 24 hours after the procedure: ? Do not drive or use machinery. ? Do not sign important documents. ? Do not drink alcohol. ? Do your regular daily activities at a slower pace than normal. ? Eat soft, easy-to-digest foods. ? Rest often.  Take over-the-counter or prescription medicines only as told by your health care provider.  It is up to you to get the results of your procedure. Ask your health care provider, or the department performing the procedure, when your results will be ready. Relieving cramping and bloating  Try walking around when you have cramps or feel bloated.  Apply heat to your  abdomen as told by your health care provider. Use a heat source that your health care provider recommends, such as a moist heat pack or a heating pad. ? Place a towel between your skin and the heat source. ? Leave the heat on for 20-30 minutes. ? Remove the heat if your skin turns bright red. This is especially important if you are unable to feel pain, heat, or cold. You may have a greater risk of getting burned. Eating and drinking  Drink enough fluid to keep your urine clear or pale yellow.  Resume your normal diet as instructed by your health care provider. Avoid heavy or fried foods that are hard to digest.  Avoid drinking alcohol for as long as instructed by your health care provider. Contact a health care provider if:  You have blood in your stool 2-3 days after the procedure. Get help right away if:  You have more than a small spotting of blood in your stool.  You pass large blood clots in your stool.  Your abdomen is swollen.  You have nausea or vomiting.  You have a fever.  You have increasing abdominal pain that is not relieved with medicine. This information is not intended to replace advice given to you by your health care  provider. Make sure you discuss any questions you have with your health care provider. Document Released: 01/01/2004 Document Revised: 02/11/2016 Document Reviewed: 07/31/2015 Elsevier Interactive Patient Education  [employer].

## 2020-01-17 ENCOUNTER — Encounter (HOSPITAL_COMMUNITY): Payer: Self-pay

## 2020-01-17 ENCOUNTER — Encounter (HOSPITAL_COMMUNITY)
Admission: RE | Admit: 2020-01-17 | Discharge: 2020-01-17 | Disposition: A | Discharge status: Home / Self Care | Payer: BC Managed Care – PPO | Source: Ambulatory Visit | Attending: Internal Medicine | Admitting: Internal Medicine

## 2020-01-17 ENCOUNTER — Other Ambulatory Visit: Payer: Self-pay

## 2020-01-27 ENCOUNTER — Other Ambulatory Visit (HOSPITAL_COMMUNITY)
Admission: RE | Admit: 2020-01-27 | Discharge: 2020-01-27 | Disposition: A | Discharge status: Home / Self Care | Payer: BC Managed Care – PPO | Source: Ambulatory Visit | Attending: Internal Medicine | Admitting: Internal Medicine

## 2020-01-27 ENCOUNTER — Other Ambulatory Visit: Payer: Self-pay

## 2020-01-27 DIAGNOSIS — Z01812 Encounter for preprocedural laboratory examination: Secondary | ICD-10-CM | POA: Diagnosis not present

## 2020-01-27 DIAGNOSIS — Z20822 Contact with and (suspected) exposure to covid-19: Secondary | ICD-10-CM | POA: Insufficient documentation

## 2020-01-27 LAB — BASIC METABOLIC PANEL
Anion gap: 8 (ref 5–15)
BUN: 11 mg/dL (ref 6–20)
CO2: 24 mmol/L (ref 22–32)
Calcium: 9 mg/dL (ref 8.9–10.3)
Chloride: 104 mmol/L (ref 98–111)
Creatinine, Ser: 0.74 mg/dL (ref 0.44–1.00)
GFR calc Af Amer: >60 mL/min (ref >60)
GFR calc non Af Amer: >60 mL/min (ref >60)
Glucose, Bld: 125 mg/dL — ABNORMAL HIGH (ref 70–99)
Potassium: 3.8 mmol/L (ref 3.5–5.1)
Sodium: 136 mmol/L (ref 135–145)

## 2020-01-27 LAB — SARS CORONAVIRUS 2 (TAT 6-24 HRS): SARS Coronavirus 2: NEGATIVE

## 2020-01-29 ENCOUNTER — Encounter (HOSPITAL_COMMUNITY): Payer: Self-pay | Admitting: Anesthesiology

## 2020-01-30 ENCOUNTER — Telehealth: Payer: Self-pay | Admitting: *Deleted

## 2020-01-30 ENCOUNTER — Ambulatory Visit (HOSPITAL_COMMUNITY)
Admission: RE | Admit: 2020-01-30 | Payer: BC Managed Care – PPO | Source: Home / Self Care | Admitting: Gastroenterology

## 2020-01-30 ENCOUNTER — Encounter (HOSPITAL_COMMUNITY): Admission: RE | Payer: Self-pay | Source: Home / Self Care

## 2020-01-30 SURGERY — COLONOSCOPY WITH PROPOFOL
Anesthesia: Monitor Anesthesia Care

## 2020-01-30 NOTE — Telephone Encounter (Signed)
Melanie from [facility] called to let us know that pt cancelled procedure.  Shawna Orleans said that pt told her that she could not tolerate prep.

## 2020-01-31 NOTE — Telephone Encounter (Signed)
Lmom for pt to call me back. 

## 2020-02-01 NOTE — Telephone Encounter (Signed)
Lmom for pt to call me back. 

## 2020-02-07 ENCOUNTER — Encounter: Payer: Self-pay | Admitting: *Deleted

## 2020-02-07 NOTE — Telephone Encounter (Signed)
Mailed pt and letter to see if she would like to reschedule her procedure.

## 2020-05-22 ENCOUNTER — Encounter: Payer: BC Managed Care – PPO | Admitting: Adult Health

## 2020-05-29 ENCOUNTER — Other Ambulatory Visit: Payer: Self-pay | Admitting: "Endocrinology

## 2020-09-24 ENCOUNTER — Other Ambulatory Visit (HOSPITAL_COMMUNITY): Payer: Self-pay | Admitting: Family Medicine

## 2020-09-24 DIAGNOSIS — Z1231 Encounter for screening mammogram for malignant neoplasm of breast: Secondary | ICD-10-CM

## 2020-10-04 ENCOUNTER — Ambulatory Visit (HOSPITAL_COMMUNITY): Payer: BC Managed Care – PPO

## 2020-10-10 ENCOUNTER — Ambulatory Visit (HOSPITAL_COMMUNITY)
Admission: RE | Admit: 2020-10-10 | Discharge: 2020-10-10 | Disposition: A | Discharge status: Home / Self Care | Payer: BC Managed Care – PPO | Source: Ambulatory Visit | Attending: Family Medicine | Admitting: Family Medicine

## 2020-10-10 DIAGNOSIS — Z1231 Encounter for screening mammogram for malignant neoplasm of breast: Secondary | ICD-10-CM | POA: Diagnosis not present

## 2021-07-19 ENCOUNTER — Other Ambulatory Visit: Payer: Self-pay

## 2021-07-19 ENCOUNTER — Other Ambulatory Visit (HOSPITAL_COMMUNITY): Payer: Self-pay | Admitting: Family Medicine

## 2021-07-19 ENCOUNTER — Ambulatory Visit (HOSPITAL_COMMUNITY)
Admission: RE | Admit: 2021-07-19 | Discharge: 2021-07-19 | Disposition: A | Discharge status: Home / Self Care | Payer: BC Managed Care – PPO | Source: Ambulatory Visit | Attending: Family Medicine | Admitting: Family Medicine

## 2021-07-19 ENCOUNTER — Other Ambulatory Visit: Payer: Self-pay | Admitting: Family Medicine

## 2021-07-19 DIAGNOSIS — N200 Calculus of kidney: Secondary | ICD-10-CM | POA: Diagnosis not present

## 2021-09-13 ENCOUNTER — Encounter (HOSPITAL_COMMUNITY)
Admission: RE | Admit: 2021-09-13 | Discharge: 2021-09-13 | Disposition: A | Discharge status: Home / Self Care | Payer: BC Managed Care – PPO | Source: Ambulatory Visit | Attending: Ophthalmology | Admitting: Ophthalmology

## 2021-09-13 ENCOUNTER — Other Ambulatory Visit: Payer: Self-pay

## 2021-09-13 ENCOUNTER — Encounter (HOSPITAL_COMMUNITY): Payer: Self-pay

## 2021-09-13 HISTORY — DX: Personal history of urinary calculi: Z87.442

## 2021-09-13 HISTORY — DX: Other specified postprocedural states: Z98.890

## 2021-09-19 NOTE — H&P (Signed)
Surgical History & Physical ? ?Patient Name: Kendra Wilson DOB: 08/25/1970 ? ?Surgery: Cataract extraction with intraocular lens implant phacoemulsification; Left Eye ? ?Surgeon: Baruch Goldmann MD ?Surgery Date:  09-23-21 ?Pre-Op Date:  09-16-21 ? ?HPI: ?A 21 Yr. old female patient is referred by Dr Jorja Loa for cataract eval. 1. 1. The patient complains of poor night vision/blurry vision while driving, which began many years ago. The left eye is affected. The episode is gradual. The condition's severity increased since last visit. Symptoms occur when the patient is driving, inside, outside and reading. The complaint is associated with glare. This This is negatively affecting the patient's quality of life and the patient is unable to function adequately in life with the current level of vision. 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 Last A1c was 6.9. HPI was performed by Baruch Goldmann . ? ?Medical History: ?Cataracts ?Hx of uveitis OS ?Diabetes - DM Type 2 ?LDL ? ?Review of Systems ?Negative Allergic/Immunologic ?Negative Cardiovascular ?Negative Constitutional ?Negative Ear, Nose, Mouth & Throat ?Negative Endocrine ?Negative Eyes ?Negative Gastrointestinal ?Negative Genitourinary ?Negative Hemotologic/Lymphatic ?Negative Integumentary ?Negative Musculoskeletal ?Negative Neurological ?Negative Psychiatry ?Negative Respiratory ? ?Social ?  Never smoked  ? ?Medication ?Victoza, Colestipol, Ezetimibe, Cetirizine, Apple cidar vinegar,  ? ?Sx/Procedures ?Gallbladder Removal, Tubal Ligation, Kidney Stone Removal, Carpal Tunnel, Wisdom Teeth Removal,  ? ?Drug Allergies  ?Demerol, Morphine, Codeine, Statins,  ? ?History & Physical: ?Heent: Cataract, Left Eye ?NECK: supple without bruits ?LUNGS: lungs clear to auscultation ?CV: regular rate and rhythm ?Abdomen: soft and non-tender ? ?Impression & Plan: ?Assessment: ?1.   COMBINED FORMS AGE RELATED CATARACT; Left Eye [id number]) ?2.  Diabetes Type 2 No retinopathy (E11.9) ?3.  Hyperopia ; Left Eye (H52.02) ?4.  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  BLEPHARITIS; Right Upper Lid, Right Lower Lid, Left Upper Lid, Left Lower Lid (H01.001, H01.002,H01.004,H01.005) ?6.  NUCLEAR SCLEROSIS AGE RELATED; Right Eye (H25.11) ?7.  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  POSTERIOR SYNECHIAE OF IRIS; Left Eye [phone]) ?9.  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 ?Plan: 1.  Cataract accounts for the patient's decreased vision. This visual impairment is not correctable with a tolerable change in glasses or contact lenses. Cataract surgery with an implantation of a new lens should significantly improve the visual and functional status of the patient. Discussed all risks, benefits, alternatives, and potential complications. Discussed the procedures and recovery. Patient desires to have surgery. A-scan ordered and performed today for intra-ocular lens calculations. The surgery will be performed in order to improve vision for driving, reading, and for eye examinations. Recommend phacoemulsification with intra-ocular lens. Recommend Dextenza for post-operative pain and inflammation. ?Left Eye only. ?Dilates poorly - shugarcaine by protocol. ?Malyugin Ring. ?Omidira. ?Consider Toric IOL - patient declines at this time. ?Along with standard drops, start Pred Acetate 1 drop 6x/day 1 week prior to surgery. Shake well. ? ?2.  Stressed importance of blood sugar and blood pressure control, and also yearly eye examinations. ?Discussed the need for ongoing proactive ocular exams and treatment, hopefully before visual symptoms develop. ? ?3.  ? ?4.  Based on cup-to-disc ratio. ?Negative Family history. ?OCT rNFL shows: WNL OU. ?IOPs WNL OU. ?Detailed discussion about glaucoma today including importance of maintaining good follow up and following treatment plan, and the possibility of  irreversible blindness as part of this disease process. ? ?5.  Recommend  regular lid cleaning. ? ?6.  Cataract not visually significant - continue to monitor. ? ?7.  Requires steroid prophylaxsis. ? ?8.  Will require lysis at time of surgery. ? ?9.  Declines toric IOL. ?

## 2021-09-23 ENCOUNTER — Ambulatory Visit (HOSPITAL_COMMUNITY)
Admission: RE | Admit: 2021-09-23 | Discharge: 2021-09-23 | Disposition: A | Discharge status: Home / Self Care | Payer: BC Managed Care – PPO | Attending: Ophthalmology | Admitting: Ophthalmology

## 2021-09-23 ENCOUNTER — Encounter (HOSPITAL_COMMUNITY): Payer: Self-pay | Admitting: Ophthalmology

## 2021-09-23 ENCOUNTER — Ambulatory Visit (HOSPITAL_COMMUNITY): Payer: BC Managed Care – PPO | Admitting: Anesthesiology

## 2021-09-23 ENCOUNTER — Encounter (HOSPITAL_COMMUNITY)
Admission: RE | Disposition: A | Discharge status: Home / Self Care | Payer: Self-pay | Source: Home / Self Care | Attending: Ophthalmology

## 2021-09-23 DIAGNOSIS — H25812 Combined forms of age-related cataract, left eye: Secondary | ICD-10-CM | POA: Insufficient documentation

## 2021-09-23 DIAGNOSIS — H2181 Floppy iris syndrome: Secondary | ICD-10-CM | POA: Insufficient documentation

## 2021-09-23 DIAGNOSIS — H52222 Regular astigmatism, left eye: Secondary | ICD-10-CM | POA: Diagnosis not present

## 2021-09-23 DIAGNOSIS — H01001 Unspecified blepharitis right upper eyelid: Secondary | ICD-10-CM | POA: Diagnosis not present

## 2021-09-23 DIAGNOSIS — H2012 Chronic iridocyclitis, left eye: Secondary | ICD-10-CM | POA: Insufficient documentation

## 2021-09-23 DIAGNOSIS — H01005 Unspecified blepharitis left lower eyelid: Secondary | ICD-10-CM | POA: Diagnosis not present

## 2021-09-23 DIAGNOSIS — H21542 Posterior synechiae (iris), left eye: Secondary | ICD-10-CM | POA: Diagnosis not present

## 2021-09-23 DIAGNOSIS — H40013 Open angle with borderline findings, low risk, bilateral: Secondary | ICD-10-CM | POA: Insufficient documentation

## 2021-09-23 DIAGNOSIS — H01002 Unspecified blepharitis right lower eyelid: Secondary | ICD-10-CM | POA: Insufficient documentation

## 2021-09-23 DIAGNOSIS — Z7984 Long term (current) use of oral hypoglycemic drugs: Secondary | ICD-10-CM | POA: Insufficient documentation

## 2021-09-23 DIAGNOSIS — H01004 Unspecified blepharitis left upper eyelid: Secondary | ICD-10-CM | POA: Diagnosis not present

## 2021-09-23 DIAGNOSIS — H5202 Hypermetropia, left eye: Secondary | ICD-10-CM | POA: Diagnosis not present

## 2021-09-23 DIAGNOSIS — E1136 Type 2 diabetes mellitus with diabetic cataract: Secondary | ICD-10-CM | POA: Diagnosis present

## 2021-09-23 HISTORY — PX: CATARACT EXTRACTION W/PHACO: SHX586

## 2021-09-23 LAB — GLUCOSE, CAPILLARY: Glucose-Capillary: 105 mg/dL — ABNORMAL HIGH (ref 70–99)

## 2021-09-23 SURGERY — PHACOEMULSIFICATION, CATARACT, WITH IOL INSERTION
Anesthesia: Monitor Anesthesia Care | Site: Eye | Laterality: Left

## 2021-09-23 MED ORDER — SODIUM HYALURONATE 10 MG/ML IO SOLUTION
PREFILLED_SYRINGE | INTRAOCULAR | Status: DC | PRN
  Administered 2021-09-23: 0.85 mL via INTRAOCULAR

## 2021-09-23 MED ORDER — LIDOCAINE HCL 3.5 % OP GEL
1.0000 "application " | Freq: Once | OPHTHALMIC | Status: AC
  Administered 2021-09-23: 1 via OPHTHALMIC

## 2021-09-23 MED ORDER — EPINEPHRINE PF 1 MG/ML IJ SOLN
INTRAMUSCULAR | Status: AC
  Filled 2021-09-23: qty 1

## 2021-09-23 MED ORDER — PHENYLEPHRINE-KETOROLAC 1-0.3 % IO SOLN
INTRAOCULAR | Status: AC
  Filled 2021-09-23: qty 4

## 2021-09-23 MED ORDER — BSS IO SOLN
INTRAOCULAR | Status: DC | PRN
  Administered 2021-09-23: 15 mL via INTRAOCULAR

## 2021-09-23 MED ORDER — TROPICAMIDE 1 % OP SOLN
1.0000 [drp] | OPHTHALMIC | Status: AC | PRN
  Administered 2021-09-23 (×3): 1 [drp] via OPHTHALMIC

## 2021-09-23 MED ORDER — PHENYLEPHRINE-KETOROLAC 1-0.3 % IO SOLN
INTRAOCULAR | Status: DC | PRN
  Administered 2021-09-23: 500 mL via OPHTHALMIC

## 2021-09-23 MED ORDER — PHENYLEPHRINE HCL 2.5 % OP SOLN
1.0000 [drp] | OPHTHALMIC | Status: AC | PRN
  Administered 2021-09-23 (×3): 1 [drp] via OPHTHALMIC

## 2021-09-23 MED ORDER — LIDOCAINE HCL (PF) 1 % IJ SOLN
INTRAOCULAR | Status: DC | PRN
  Administered 2021-09-23: 1 mL via OPHTHALMIC

## 2021-09-23 MED ORDER — POVIDONE-IODINE 5 % OP SOLN
OPHTHALMIC | Status: DC | PRN
  Administered 2021-09-23: 1 via OPHTHALMIC

## 2021-09-23 MED ORDER — TETRACAINE HCL 0.5 % OP SOLN
1.0000 [drp] | OPHTHALMIC | Status: AC | PRN
  Administered 2021-09-23 (×3): 1 [drp] via OPHTHALMIC

## 2021-09-23 MED ORDER — SODIUM HYALURONATE 23MG/ML IO SOSY
PREFILLED_SYRINGE | INTRAOCULAR | Status: DC | PRN
  Administered 2021-09-23: 0.6 mL via INTRAOCULAR

## 2021-09-23 MED ORDER — STERILE WATER FOR IRRIGATION IR SOLN
Status: DC | PRN
Start: 2021-09-23 — End: 2021-09-23
  Administered 2021-09-23: 500 mL

## 2021-09-23 MED ORDER — NEOMYCIN-POLYMYXIN-DEXAMETH 3.5-10000-0.1 OP SUSP
OPHTHALMIC | Status: DC | PRN
Start: 2021-09-23 — End: 2021-09-23
  Administered 2021-09-23: 2 [drp] via OPHTHALMIC

## 2021-09-23 SURGICAL SUPPLY — 17 items
CATARACT SUITE SIGHTPATH (MISCELLANEOUS) ×2 IMPLANT
CLOTH BEACON ORANGE TIMEOUT ST (SAFETY) ×2 IMPLANT
EYE SHIELD UNIVERSAL CLEAR (GAUZE/BANDAGES/DRESSINGS) ×1 IMPLANT
FEE CATARACT SUITE SIGHTPATH (MISCELLANEOUS) ×1 IMPLANT
GLOVE BIOGEL PI IND STRL 7.0 (GLOVE) ×2 IMPLANT
GLOVE BIOGEL PI INDICATOR 7.0 (GLOVE) ×1
GLOVE SURG UNDER POLY LF SZ6.5 (GLOVE) ×1 IMPLANT
LENS IOL RAYNER 23.5 (Intraocular Lens) ×2 IMPLANT
LENS IOL RAYONE EMV 23.5 (Intraocular Lens) IMPLANT
NDL HYPO 18GX1.5 BLUNT FILL (NEEDLE) ×1 IMPLANT
NEEDLE HYPO 18GX1.5 BLUNT FILL (NEEDLE) ×2 IMPLANT
PAD ARMBOARD 7.5X6 YLW CONV (MISCELLANEOUS) ×2 IMPLANT
RING MALYGIN 7.0 (MISCELLANEOUS) ×1 IMPLANT
SYR TB 1ML LL NO SAFETY (SYRINGE) ×2 IMPLANT
TAPE SURG TRANSPORE 1 IN (GAUZE/BANDAGES/DRESSINGS) IMPLANT
TAPE SURGICAL TRANSPORE 1 IN (GAUZE/BANDAGES/DRESSINGS) ×1
WATER STERILE IRR 250ML POUR (IV SOLUTION) ×2 IMPLANT

## 2021-09-23 NOTE — Anesthesia Preprocedure Evaluation (Addendum)
Anesthesia Evaluation  ?Patient identified by MRN, date of birth, ID band ?Patient awake ? ? ? ?Reviewed: ?Allergy & Precautions, NPO status , Patient's Chart, lab work & pertinent test results ? ?History of Anesthesia Complications ?(+) PONV and history of anesthetic complications ? ?Airway ?Mallampati: II ? ?TM Distance: >3 FB ?Neck ROM: Full ? ? ? Dental ? ?(+) Dental Advisory Given, Teeth Intact ?  ?Pulmonary ?neg pulmonary ROS,  ?  ?Pulmonary exam normal ?breath sounds clear to auscultation ? ? ? ? ? ? Cardiovascular ?Exercise Tolerance: Good ?negative cardio ROS ?Normal cardiovascular exam ?Rhythm:Regular Rate:Normal ? ? ?  ?Neuro/Psych ?negative neurological ROS ? negative psych ROS  ? GI/Hepatic ?negative GI ROS, Neg liver ROS,   ?Endo/Other  ?diabetes, Well Controlled, Type 2, Oral Hypoglycemic Agents ? Renal/GU ?negative Renal ROS  ?negative genitourinary ?  ?Musculoskeletal ?negative musculoskeletal ROS ?(+)  ? Abdominal ?  ?Peds ?negative pediatric ROS ?(+)  Hematology ?negative hematology ROS ?(+)   ?Anesthesia Other Findings ? ? Reproductive/Obstetrics ?negative OB ROS ? ?  ? ? ? ? ? ? ? ? ? ? ? ? ? ?  ?  ? ? ? ? ? ? ? ?Anesthesia Physical ?Anesthesia Plan ? ?ASA: 2 ? ?Anesthesia Plan: MAC  ? ?Post-op Pain Management: Minimal or no pain anticipated  ? ?Induction: Intravenous ? ?PONV Risk Score and Plan:  ? ?Airway Management Planned: Nasal Cannula and Natural Airway ? ?Additional Equipment:  ? ?Intra-op Plan:  ? ?Post-operative Plan:  ? ?Informed Consent: I have reviewed the patients History and Physical, chart, labs and discussed the procedure including the risks, benefits and alternatives for the proposed anesthesia with the patient or authorized representative who has indicated his/her understanding and acceptance.  ? ? ? ?Dental advisory given ? ?Plan Discussed with: CRNA and Surgeon ? ?Anesthesia Plan Comments:   ? ? ? ? ? ? ?Anesthesia Quick Evaluation ? ?

## 2021-09-23 NOTE — Discharge Instructions (Signed)
Please discharge patient when stable, will follow up today with Dr. Montie Swiderski at the [facility] office immediately following discharge.  Leave shield in place until visit.  All paperwork with discharge instructions will be given at the office.  [facility] Address:  [address]  [place], [place] [address]  

## 2021-09-23 NOTE — Interval H&P Note (Signed)
History and Physical Interval Note: ? ?09/23/2021 ?11:24 AM ? ?Kendra Wilson  has presented today for surgery, with the diagnosis of combined forms age related cataract; left.  The various methods of treatment have been discussed with the patient and family. After consideration of risks, benefits and other options for treatment, the patient has consented to  Procedure(s) with comments: ?CATARACT EXTRACTION PHACO AND INTRAOCULAR LENS PLACEMENT (IOC) (Left) - left as a surgical intervention.  The patient's history has been reviewed, patient examined, no change in status, stable for surgery.  I have reviewed the patient's chart and labs.  Questions were answered to the patient's satisfaction.   ? ? ?Fabio Pierce ? ? ?

## 2021-09-23 NOTE — Op Note (Signed)
Date of procedure: 09/23/21 ? ?Pre-operative diagnosis:  ?1.Visually significant age-related cataract, Left Eye; Poor dilation, Left eye (H25.812)  ?2. Posterior synechiae, left eye ? ?Post-operative diagnosis: Visually significant age-related cataract, Left Eye; Intra-operative Floppy Iris Syndrome, Left Eye (H21.81) ? ?Procedure:  ?1.Complex removal of cataract via phacoemulsification and insertion of intra-ocular lens Rayner [id number] +23.5D into the capsular bag of the Left Eye (CPT [phone]) ?2. Lysis of synechiae, left eye ? ?Attending surgeon: Gerda Diss. Marisa Hua, MD, MA ? ?Anesthesia: MAC, Topical Akten ? ?Complications: None ? ?Estimated Blood Loss: <88m (minimal) ? ?Specimens: None ? ?Implants: As above ? ?Indications:  Visually significant cataract, Left Eye ? ?Procedure:  ?The patient was seen and identified in the pre-operative area. The operative eye was identified and dilated.  The operative eye was marked.  Topical anesthesia was administered to the operative eye.    ? ?The patient was then to the operative suite and placed in the supine position.  A timeout was performed confirming the patient, procedure to be performed, and all other relevant information.   The patient's face was prepped and draped in the usual fashion for intra-ocular surgery.  A lid speculum was placed into the operative eye and the surgical microscope moved into place and focused.  Poor dilation of the iris was confirmed.  Significant posterior synechiae were confirmed.  An inferotemporal paracentesis was created using a 20 gauge paracentesis blade.  Shugarcaine was injected into the anterior chamber.  Viscoelastic was injected into the anterior chamber.  A temporal clear-corneal main wound incision was created using a 2.459mmicrokeratome. The posterior synechiae were released.  As much of the inflammatory pupillary membrane was removed as safely possible. A Malyugin ring was placed.  A continuous curvilinear capsulorrhexis was  initiated using an irrigating cystitome and completed using capsulorrhexis forceps.  Hydrodissection and hydrodeliniation were performed.  Viscoelastic was injected into the anterior chamber.  A phacoemulsification handpiece and a chopper as a second instrument were used to remove the nucleus and epinucleus. The irrigation/aspiration handpiece was used to remove any remaining cortical material.  ? ?The capsular bag was reinflated with viscoelastic, checked, and found to be intact.  The intraocular lens was inserted into the capsular bag and dialed into place using a MaSurveyor, mineralsThe Malyugin ring was removed.  The irrigation/aspiration handpiece was used to remove any remaining viscoelastic.  The clear corneal wound and paracentesis wounds were then hydrated and checked with Weck-Cels to be watertight.  The lid-speculum and drape was removed, and the patient's face was cleaned with a wet and dry 4x4.  Maxitrol was instilled in the eye. A clear shield was taped over the eye. The patient was taken to the post-operative care unit in good condition, having tolerated the procedure well. ? ?Post-Op Instructions: [facility] and will receive all other orders and instructions. ? ?

## 2021-09-23 NOTE — Anesthesia Postprocedure Evaluation (Signed)
Anesthesia Post Note ? ?Patient: Kendra Wilson ? ?Procedure(s) Performed: CATARACT EXTRACTION PHACO AND INTRAOCULAR LENS PLACEMENT (IOC) (Left: Eye) ? ?Patient location during evaluation: Phase II ?Anesthesia Type: MAC ?Level of consciousness: awake and alert and oriented ?Pain management: pain level controlled ?Vital Signs Assessment: post-procedure vital signs reviewed and stable ?Respiratory status: spontaneous breathing, nonlabored ventilation and respiratory function stable ?Cardiovascular status: blood pressure returned to baseline and stable ?Postop Assessment: no apparent nausea or vomiting ?Anesthetic complications: no ? ? ?No notable events documented. ? ? ?Last Vitals:  ?Vitals:  ? 09/23/21 1116 09/23/21 1235  ?BP: 122/70 118/63  ?Pulse: 79 88  ?Resp: 16 16  ?Temp: 36.9 ?C 36.7 ?C  ?SpO2: 100% 99%  ?  ?Last Pain:  ?Vitals:  ? 09/23/21 1235  ?TempSrc: Oral  ?PainSc: 0-No pain  ? ? ?  ?  ?  ?  ?  ?  ? ?Korayma Hagwood C Aarion Kittrell ? ? ? ? ?

## 2021-09-23 NOTE — Transfer of Care (Signed)
Immediate Anesthesia Transfer of Care Note ? ?Patient: Kendra Wilson ? ?Procedure(s) Performed: CATARACT EXTRACTION PHACO AND INTRAOCULAR LENS PLACEMENT (IOC) (Left: Eye) ? ?Patient Location: PACU ? ?Anesthesia Type:MAC ? ?Level of Consciousness: awake, alert  and oriented ? ?Airway & Oxygen Therapy: Patient Spontanous Breathing ? ?Post-op Assessment: Report given to RN, Post -op Vital signs reviewed and stable, Patient moving all extremities X 4 and Patient able to stick tongue midline ? ?Post vital signs: Reviewed ? ?Last Vitals:  ?Vitals Value Taken Time  ?BP 118/63 09/23/21 1235  ?Temp 36.7 ?C 09/23/21 1235  ?Pulse 88 09/23/21 1235  ?Resp 16 09/23/21 1235  ?SpO2 99 % 09/23/21 1235  ? ? ?Last Pain:  ?Vitals:  ? 09/23/21 1235  ?TempSrc: Oral  ?PainSc: 0-No pain  ?   ? ?Patients Stated Pain Goal: 8 (09/23/21 1116) ? ?Complications: No notable events documented. ?

## 2021-09-24 ENCOUNTER — Encounter (HOSPITAL_COMMUNITY): Payer: Self-pay | Admitting: Ophthalmology

## 2021-09-25 ENCOUNTER — Other Ambulatory Visit (HOSPITAL_COMMUNITY): Payer: Self-pay | Admitting: Family Medicine

## 2021-09-25 DIAGNOSIS — Z1231 Encounter for screening mammogram for malignant neoplasm of breast: Secondary | ICD-10-CM

## 2021-10-16 ENCOUNTER — Ambulatory Visit (HOSPITAL_COMMUNITY): Payer: BC Managed Care – PPO

## 2021-10-23 ENCOUNTER — Ambulatory Visit (HOSPITAL_COMMUNITY)
Admission: RE | Admit: 2021-10-23 | Discharge: 2021-10-23 | Disposition: A | Discharge status: Home / Self Care | Payer: BC Managed Care – PPO | Source: Ambulatory Visit | Attending: Family Medicine | Admitting: Family Medicine

## 2021-10-23 DIAGNOSIS — Z1231 Encounter for screening mammogram for malignant neoplasm of breast: Secondary | ICD-10-CM | POA: Insufficient documentation

## 2023-02-04 ENCOUNTER — Other Ambulatory Visit (HOSPITAL_COMMUNITY): Payer: Self-pay | Admitting: Family Medicine

## 2023-02-04 DIAGNOSIS — Z1231 Encounter for screening mammogram for malignant neoplasm of breast: Secondary | ICD-10-CM

## 2023-02-11 ENCOUNTER — Ambulatory Visit (HOSPITAL_COMMUNITY)
Admission: RE | Admit: 2023-02-11 | Discharge: 2023-02-11 | Disposition: A | Discharge status: Home / Self Care | Payer: BC Managed Care – PPO | Source: Ambulatory Visit | Attending: Family Medicine | Admitting: Family Medicine

## 2023-02-11 ENCOUNTER — Encounter (HOSPITAL_COMMUNITY): Payer: Self-pay

## 2023-02-11 DIAGNOSIS — Z1231 Encounter for screening mammogram for malignant neoplasm of breast: Secondary | ICD-10-CM | POA: Diagnosis present

## 2023-07-12 ENCOUNTER — Ambulatory Visit
Admission: EM | Admit: 2023-07-12 | Discharge: 2023-07-12 | Disposition: A | Discharge status: Home / Self Care | Payer: 59

## 2023-07-12 ENCOUNTER — Encounter: Payer: Self-pay | Admitting: Emergency Medicine

## 2023-07-12 ENCOUNTER — Other Ambulatory Visit: Payer: Self-pay

## 2023-07-12 DIAGNOSIS — J069 Acute upper respiratory infection, unspecified: Secondary | ICD-10-CM

## 2023-07-12 LAB — POC COVID19/FLU A&B COMBO
Covid Antigen, POC: NEGATIVE
Influenza A Antigen, POC: NEGATIVE
Influenza B Antigen, POC: NEGATIVE

## 2023-07-12 MED ORDER — PROMETHAZINE-DM 6.25-15 MG/5ML PO SYRP: 5.0000 mL | ORAL_SOLUTION | Freq: Every evening | ORAL | 0 refills | Status: DC | PRN

## 2023-07-12 MED ORDER — PREDNISONE 20 MG PO TABS: 40.0000 mg | ORAL_TABLET | Freq: Every day | ORAL | 0 refills | Status: DC

## 2023-07-12 NOTE — ED Provider Notes (Signed)
 [facility]    CSN: [id number] Arrival date & time: 07/12/23  0858      History   Chief Complaint Chief Complaint  Patient presents with   Cough    HPI Kendra Wilson is a 53 y.o. female.   Patient presenting today with 6-day history of ongoing sneezing, rhinorrhea, productive cough, headache, fatigue.  Denies fever, chest pain, shortness of breath, abdominal pain, nausea vomiting or diarrhea.  So far trying Sudafed, Zyrtec, nasal sprays, humidifiers with mild temporary benefit.    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      Home Medications    Prior to Admission medications   Medication Sig Start Date End Date Taking? Authorizing Provider  Dulaglutide (TRULICITY) 0.75 MG/0.5ML SOAJ Inject 0.75 mg into the skin.   Yes [provider]  predniSONE  (DELTASONE ) 20 MG tablet Take 2 tablets (40 mg total) by mouth daily with breakfast. 07/12/23  Yes Stuart Vernell Norris, PA-C  promethazine -dextromethorphan  (PROMETHAZINE -DM) 6.25-15 MG/5ML syrup Take 5 mLs by mouth at bedtime as needed. 07/12/23  Yes Stuart Vernell Norris, PA-C  APPLE CIDER VINEGAR PO Take 2 tablets by mouth daily.     [provider]  Blood Glucose Monitoring Suppl (ACCU-CHEK GUIDE) w/Device KIT 1 Piece by Does not apply route as directed. 04/22/19   Nida, Gebreselassie W, MD  colestipol  (COLESTID ) 1 g tablet TAKE 1 TABLET (1 G TOTAL) BY MOUTH 3 (THREE) TIMES DAILY. 02/22/19   Comer Kirsch, PA-C  glucose blood (ONETOUCH VERIO) test strip Use as directed to check blood glucose two times daily 08/31/19   Nida, Gebreselassie W, MD  Insulin Pen Needle (B-D ULTRAFINE III SHORT PEN) 31G X 8 MM MISC 1 each by Does not apply route as directed. 04/22/19   Nida, Gebreselassie W, MD  Lancets Thin MISC 1 each by Other route 4 (four) times daily. 04/26/19   Nida, Gebreselassie W, MD  Multiple Vitamins-Minerals (MULTIVITAMIN WOMEN PO) Take 1 tablet by mouth daily.     [provider]  VICTOZA 18 MG/3ML SOPN Inject 1.8 mg into the skin daily. 12/20/19   [provider]    Family History History reviewed. No pertinent family history.  Social History Social History   Tobacco Use   Smoking status: Never   Smokeless tobacco: Never  Vaping Use   Vaping status: Never Used  Substance Use Topics   Alcohol use: No   Drug use: No  Allergies   Codeine, Demerol [meperidine], Morphine and codeine, and Statins   Review of Systems Review of Systems HPI  Physical Exam Triage Vital Signs ED Triage Vitals  Encounter Vitals Group     BP 07/12/23 1112 114/75     Systolic BP Percentile --      Diastolic BP Percentile --      Pulse Rate 07/12/23 1112 74     Resp 07/12/23 1112 20     Temp 07/12/23 1112 98 F (36.7 C)     Temp Source 07/12/23 1112 Oral     SpO2 07/12/23 1112 98 %     Weight --      Height --      Head Circumference --      Peak Flow --      Pain Score 07/12/23 1110 1     Pain Loc --       Pain Education --      Exclude from Growth Chart --    No data found.  Updated Vital Signs BP 114/75 (BP Location: Right Arm)   Pulse 74   Temp 98 F (36.7 C) (Oral)   Resp 20   LMP 08/31/2017   SpO2 98%   Visual Acuity Right Eye Distance:   Left Eye Distance:   Bilateral Distance:    Right Eye Near:   Left Eye Near:    Bilateral Near:     Physical Exam Vitals and nursing note reviewed.  Constitutional:      Appearance: Normal appearance.  HENT:     Head: Atraumatic.     Right Ear: Tympanic membrane and external ear normal.     Left Ear: Tympanic membrane and external ear normal.     Nose: Rhinorrhea present.     Mouth/Throat:     Mouth: Mucous membranes are moist.     Pharynx: Posterior oropharyngeal erythema present.  Eyes:     Extraocular Movements: Extraocular movements intact.     Conjunctiva/sclera: Conjunctivae normal.  Cardiovascular:     Rate and Rhythm: Normal rate and regular rhythm.     Heart sounds: Normal heart sounds.  Pulmonary:     Effort: Pulmonary effort is normal.     Breath sounds: Normal breath sounds. No wheezing or rales.  Musculoskeletal:        General: Normal range of motion.     Cervical back: Normal range of motion and neck supple.  Skin:    General: Skin is warm and dry.  Neurological:     Mental Status: She is alert and oriented to person, place, and time.  Psychiatric:        Mood and Affect: Mood normal.        Thought Content: Thought content normal.      UC Treatments / Results  Labs (all labs ordered are listed, but only abnormal results are displayed) Labs Reviewed  POC COVID19/FLU A&B COMBO    EKG   Radiology No results found.  Procedures Procedures (including critical care time)  Medications Ordered in UC Medications - No data to display  Initial Impression / Assessment and Plan / UC Course  I have reviewed the triage vital signs and the nursing notes.  Pertinent labs & imaging results that were  available during my care of the patient were reviewed by me and considered in my medical decision making (see chart for details).     Suspect viral respiratory infection.  COVID and flu testing negative, vitals and exam very  reassuring today.  Given duration of symptoms we will treat with prednisone , Phenergan  DM, supportive over-the-counter medications and home care.  Return for worsening symptoms.  Final Clinical Impressions(s) / UC Diagnoses   Final diagnoses:  Viral URI with cough   Discharge Instructions   None    ED Prescriptions     Medication Sig Dispense Auth. Provider   predniSONE  (DELTASONE ) 20 MG tablet Take 2 tablets (40 mg total) by mouth daily with breakfast. 10 tablet Stuart Vernell Norris, PA-C   promethazine -dextromethorphan (PROMETHAZINE -DM) 6.25-15 MG/5ML syrup Take 5 mLs by mouth at bedtime as needed. 100 mL Stuart Vernell Norris, [place]      PDMP not reviewed this encounter.   Stuart Vernell Norris, [place] 07/12/23 1205

## 2023-07-12 NOTE — ED Triage Notes (Signed)
 Pt reports sneezing, runny nose, headache, fatigue since Monday.

## 2023-08-17 ENCOUNTER — Emergency Department (HOSPITAL_COMMUNITY)
Admission: EM | Admit: 2023-08-17 | Discharge: 2023-08-17 | Disposition: A | Discharge status: Home / Self Care | Attending: Emergency Medicine | Admitting: Emergency Medicine

## 2023-08-17 ENCOUNTER — Emergency Department (HOSPITAL_COMMUNITY)

## 2023-08-17 ENCOUNTER — Encounter (HOSPITAL_COMMUNITY): Payer: Self-pay

## 2023-08-17 ENCOUNTER — Other Ambulatory Visit: Payer: Self-pay

## 2023-08-17 DIAGNOSIS — R1013 Epigastric pain: Secondary | ICD-10-CM | POA: Diagnosis not present

## 2023-08-17 DIAGNOSIS — R1011 Right upper quadrant pain: Secondary | ICD-10-CM | POA: Insufficient documentation

## 2023-08-17 DIAGNOSIS — E119 Type 2 diabetes mellitus without complications: Secondary | ICD-10-CM | POA: Insufficient documentation

## 2023-08-17 DIAGNOSIS — R110 Nausea: Secondary | ICD-10-CM | POA: Diagnosis not present

## 2023-08-17 DIAGNOSIS — Z794 Long term (current) use of insulin: Secondary | ICD-10-CM | POA: Diagnosis not present

## 2023-08-17 DIAGNOSIS — R1010 Upper abdominal pain, unspecified: Secondary | ICD-10-CM | POA: Diagnosis present

## 2023-08-17 LAB — COMPREHENSIVE METABOLIC PANEL
ALT: 18 U/L (ref 0–44)
AST: 19 U/L (ref 15–41)
Albumin: 4 g/dL (ref 3.5–5.0)
Alkaline Phosphatase: 81 U/L (ref 38–126)
Anion gap: 11 (ref 5–15)
BUN: 16 mg/dL (ref 6–20)
CO2: 24 mmol/L (ref 22–32)
Calcium: 9 mg/dL (ref 8.9–10.3)
Chloride: 104 mmol/L (ref 98–111)
Creatinine, Ser: 0.91 mg/dL (ref 0.44–1.00)
GFR, Estimated: >60 mL/min (ref >60)
Glucose, Bld: 166 mg/dL — ABNORMAL HIGH (ref 70–99)
Potassium: 3.9 mmol/L (ref 3.5–5.1)
Sodium: 139 mmol/L (ref 135–145)
Total Bilirubin: 1.3 mg/dL — ABNORMAL HIGH (ref 0.0–1.2)
Total Protein: 7.6 g/dL (ref 6.5–8.1)

## 2023-08-17 LAB — URINALYSIS, ROUTINE W REFLEX MICROSCOPIC
Bilirubin Urine: NEGATIVE
Glucose, UA: 50 mg/dL — AB
Hgb urine dipstick: NEGATIVE
Ketones, ur: NEGATIVE mg/dL
Leukocytes,Ua: NEGATIVE
Nitrite: NEGATIVE
Protein, ur: NEGATIVE mg/dL
Specific Gravity, Urine: 1.027 (ref 1.005–1.030)
pH: 5 (ref 5.0–8.0)

## 2023-08-17 LAB — CBC
HCT: 41.6 % (ref 36.0–46.0)
Hemoglobin: 14.1 g/dL (ref 12.0–15.0)
MCH: 31.5 pg (ref 26.0–34.0)
MCHC: 33.9 g/dL (ref 30.0–36.0)
MCV: 92.9 fL (ref 80.0–100.0)
Platelets: 191 10*3/uL (ref 150–400)
RBC: 4.48 MIL/uL (ref 3.87–5.11)
RDW: 11.9 % (ref 11.5–15.5)
WBC: 6.5 10*3/uL (ref 4.0–10.5)
nRBC: 0 % (ref 0.0–0.2)

## 2023-08-17 LAB — LIPASE, BLOOD: Lipase: 37 U/L (ref 11–51)

## 2023-08-17 MED ORDER — IOHEXOL 300 MG/ML  SOLN
100.0000 mL | Freq: Once | INTRAMUSCULAR | Status: AC | PRN
  Administered 2023-08-17: 100 mL via INTRAVENOUS

## 2023-08-17 MED ORDER — ALUM & MAG HYDROXIDE-SIMETH 200-200-20 MG/5ML PO SUSP
30.0000 mL | Freq: Once | ORAL | Status: AC
  Administered 2023-08-17: 30 mL via ORAL
  Filled 2023-08-17: qty 30

## 2023-08-17 MED ORDER — ONDANSETRON HCL 4 MG/2ML IJ SOLN
4.0000 mg | Freq: Once | INTRAMUSCULAR | Status: AC
Start: 2023-08-17 — End: 2023-08-17
  Administered 2023-08-17: 4 mg via INTRAVENOUS
  Filled 2023-08-17: qty 2

## 2023-08-17 MED ORDER — PANTOPRAZOLE SODIUM 40 MG PO TBEC: 40.0000 mg | DELAYED_RELEASE_TABLET | Freq: Every day | ORAL | 0 refills | Status: DC

## 2023-08-17 MED ORDER — ONDANSETRON 4 MG PO TBDP: 4.0000 mg | ORAL_TABLET | Freq: Four times a day (QID) | ORAL | 0 refills | Status: DC | PRN

## 2023-08-17 NOTE — ED Provider Notes (Cosign Needed Addendum)
 [place] EM[facility]ENC[facility] DEPARTMENT AT [facility] Provider Note   CSN: [id number] Arrival date & time: 08/17/23  1257     History  Chief Complaint  Patient presents with   Abdominal Pain    Kendra Wilson is a 53 y.o. female.  History of diabetes she presents the ER today complaining of upper abdominal pain that started around 10 PM last night, she had eaten a hot dog several hours prior, has not had much to eat since then, she reports she has been nauseous but has not had vomiting.  She has had several episodes of loose stools.  No dysuria.  She is a pain is in her epigastric area and right upper abdomen radiates to her upper back, feels similar to when she had her gallbladder removed several years ago.  She states she has had this pain on and off for months but got much worse last night.   Abdominal Pain      Home Medications Prior to Admission medications   Medication Sig Start Date End Date Taking? Authorizing Provider  ondansetron (ZOFRAN-ODT) 4 MG disintegrating tablet Take 1 tablet (4 mg total) by mouth every 6 (six) hours as needed for nausea or vomiting. 08/17/23  Yes Elliette Seabolt A, PA-C  pantoprazole (PROTONIX) 40 MG tablet Take 1 tablet (40 mg total) by mouth daily. 08/17/23  Yes Darrly Loberg A, PA-C  APPLE CIDER VINEGAR PO Take 2 tablets by mouth daily.     [provider]  Blood Glucose Monitoring Suppl (ACCU-CHEK GUIDE) w/Device KIT 1 Piece by Does not apply route as directed. 04/22/19   Roma Kayser, MD  colestipol (COLESTID) 1 g tablet TAKE 1 TABLET (1 G TOTAL) BY MOUTH 3 (THREE) TIMES DAILY. 02/22/19   Jacquelin Hawking, PA-C  Dulaglutide (TRULICITY) 0.75 MG/0.5ML SOAJ Inject 0.75 mg into the skin.    [provider]  glucose blood (ONETOUCH VERIO) test strip Use as directed to check blood glucose two times daily 08/31/19   Roma Kayser, MD  Insulin Pen Needle (B-D ULTRAFINE III SHORT PEN) 31G X 8 MM MISC 1 each by  Does not apply route as directed. 04/22/19   Roma Kayser, MD  Lancets Thin MISC 1 each by Other route 4 (four) times daily. 04/26/19   Roma Kayser, MD  Multiple Vitamins-Minerals (MULTIVITAMIN WOMEN PO) Take 1 tablet by mouth daily.     [provider]  predniSONE (DELTASONE) 20 MG tablet Take 2 tablets (40 mg total) by mouth daily with breakfast. 07/12/23   Particia Nearing, PA-C  promethazine-dextromethorphan (PROMETHAZINE-DM) 6.25-15 MG/5ML syrup Take 5 mLs by mouth at bedtime as needed. 07/12/23   Particia Nearing, PA-C  VICTOZA 18 MG/3ML SOPN Inject 1.8 mg into the skin daily. 12/20/19   [provider]      Allergies    Codeine, Demerol [meperidine], Morphine and codeine, and Statins    Review of Systems   Review of Systems  Gastrointestinal:  Positive for abdominal pain.    Physical Exam Updated Vital Signs BP 106/69 (BP Location: Left Arm)   Pulse 95   Temp 98.8 F (37.1 C) (Oral)   Resp 17   Ht 5\' 7"  (1.702 m)   Wt 84.5 kg   LMP 08/31/2017   SpO2 97%   BMI 29.18 kg/m  Physical Exam Vitals and nursing note reviewed.  Constitutional:      General: She is not in acute distress.    Appearance: She is  well-developed.  HENT:     Head: Normocephalic and atraumatic.  Eyes:     Conjunctiva/sclera: Conjunctivae normal.  Cardiovascular:     Rate and Rhythm: Normal rate and regular rhythm.     Heart sounds: No murmur heard. Pulmonary:     Effort: Pulmonary effort is normal. No respiratory distress.     Breath sounds: Normal breath sounds.  Abdominal:     Palpations: Abdomen is soft.     Tenderness: There is no abdominal tenderness.     Comments: Mild epigastric and right upper quadrant abdominal pain.  No CVA tenderness, no lower abdominal tenderness, no suprapubic pain  Musculoskeletal:        General: No swelling.     Cervical back: Neck supple.  Skin:    General: Skin is warm and dry.     Capillary Refill: Capillary  refill takes less than 2 seconds.  Neurological:     Mental Status: She is alert.  Psychiatric:        Mood and Affect: Mood normal.     ED Results / Procedures / Treatments   Labs (all labs ordered are listed, but only abnormal results are displayed) Labs Reviewed  COMPREHENSIVE METABOLIC PANEL - Abnormal; Notable for the following components:      Result Value   Glucose, Bld 166 (*)    Total Bilirubin 1.3 (*)    All other components within normal limits  URINALYSIS, ROUTINE W REFLEX MICROSCOPIC - Abnormal; Notable for the following components:   Glucose, UA 50 (*)    All other components within normal limits  LIPASE, BLOOD  CBC    EKG None  Radiology CT ABDOMEN PELVIS W CONTRAST Result Date: 08/17/2023 CLINICAL DATA:  Acute abdominal pain. EXAM: CT ABDOMEN AND PELVIS WITH CONTRAST TECHNIQUE: Multidetector CT imaging of the abdomen and pelvis was performed using the standard protocol following bolus administration of intravenous contrast. RADIATION DOSE REDUCTION: This exam was performed according to the departmental dose-optimization program which includes automated exposure control, adjustment of the mA and/or kV according to patient size and/or use of iterative reconstruction technique. CONTRAST:  OMNIPAQUE IOHEXOL 300 MG/ML  SOLN COMPARISON:  None Available. FINDINGS: Lower Chest: No acute findings. Hepatobiliary: No suspicious hepatic masses identified. Prior cholecystectomy. No evidence of biliary obstruction. Pancreas:  No mass or inflammatory changes. Spleen: Within normal limits in size and appearance. Adrenals/Urinary Tract: No suspicious masses identified. No evidence of ureteral calculi or hydronephrosis. Stomach/Bowel: Small hiatal hernia noted. No evidence of obstruction, inflammatory process or abnormal fluid collections. Normal appendix visualized. Mild diffuse colonic diverticulosis is seen, without signs of diverticulitis. Vascular/Lymphatic: No pathologically  enlarged lymph nodes. No acute vascular findings. Reproductive:  No mass or other significant abnormality. Other:  None. Musculoskeletal:  No suspicious bone lesions identified. IMPRESSION: No acute findings. Colonic diverticulosis, without radiographic evidence of diverticulitis. Small hiatal hernia. Electronically Signed   By: Danae Orleans M.D.   On: 08/17/2023 16:45    Procedures Procedures    Medications Ordered in ED Medications  iohexol (OMNIPAQUE) 300 MG/ML solution 100 mL (100 mLs Intravenous Contrast Given 08/17/23 1603)  ondansetron (ZOFRAN) injection 4 mg (4 mg Intravenous Given 08/17/23 1750)  alum & mag hydroxide-simeth (MAALOX/MYLANTA) 200-200-20 MG/5ML suspension 30 mL (30 mLs Oral Given 08/17/23 1758)    ED Course/ Medical Decision Making/ A&P  Medical Decision Making DDx: gastritis, gastroenteritis, otitis appendicitis, cholecystitis, diverticulitis, DKA, nephrolithiasis, gastroparesis, other  ED course: Patient having upper abdominal pain since last night, pain has much improved in the ER, however.  Labs are overall very reassuring, she is tolerating p.o., no urinary symptoms.  She was given Zofran and GI cocktail in ER with significant change but had already improved significantly prior to arrival.  Discussed with patient that her CT was very reassuring, no signs of acute findings at this time.  Given that the pain has been intermittent for the past several months and seems to be worse after eating will treat with a PPI for possible stratus versus GERD.  Advised on PCP follow-up and GI follow-up if symptoms are persistent.  She was given strict return precautions.     Amount and/or Complexity of Data Reviewed External Data Reviewed: labs and notes. Labs: ordered.    Details: CBC, CMP urinalysis all normal, lipase is normal Radiology: ordered and independent interpretation performed.    Details: CT shows no obstruction, no appendicitis,  no renal stones or diverticulitis no inflammatory changes surrounding pancreas, no evidence of biliary obstruction.  I agree with radiology read.  Risk OTC drugs. Prescription drug management.           Final Clinical Impression(s) / ED Diagnoses Final diagnoses:  Pain of upper abdomen    Rx / DC Orders ED Discharge Orders          Ordered    pantoprazole (PROTONIX) 40 MG tablet  Daily        08/17/23 1734    ondansetron (ZOFRAN-ODT) 4 MG disintegrating tablet  Every 6 hours PRN        08/17/23 1734              Ma Rings, PA-C 08/17/23 1815    Josem Kaufmann 08/17/23 1815    Eber Hong, MD 08/19/23 1815

## 2023-08-17 NOTE — ED Triage Notes (Signed)
 Pt arrived via POV c/o RLQ Abdominal pain that has been intermittent for past several days. Pt reports this morning though, she developed N/V/D and pain began radiating to her back.

## 2023-08-17 NOTE — Discharge Instructions (Addendum)
 It was a pleasure taking care of you today.  You were evaluated in the ER for abdominal pain.  Your results showed no acute findings, specifically no kidney stone, no UTI, no colitis or diverticulitis, no appendicitis.  I am prescribing Protonix which will decrease the amount of acid your stomach produces.  Avoid fatty or greasy foods.  Follow-up with your PCP, if your symptoms are continuing he should also follow-up with the stomach doctor.  If you have any new or worsening symptoms- especially if you have worsening pain, vomiting, fevers or any other worrisome changes-  come back to the ER right away.  It is important to follow-up closely with your primary care doctor and/or any specialist as discussed.  Come back to the ER if you have any new or worsening symptoms.

## 2023-08-26 ENCOUNTER — Encounter: Payer: Self-pay | Admitting: *Deleted

## 2023-08-26 ENCOUNTER — Encounter: Payer: Self-pay | Admitting: Gastroenterology

## 2023-08-26 ENCOUNTER — Ambulatory Visit: Admitting: Gastroenterology

## 2023-08-26 ENCOUNTER — Telehealth: Payer: Self-pay | Admitting: *Deleted

## 2023-08-26 VITALS — BP 108/61 | HR 69 | Temp 97.8°F | Ht 67.0 in | Wt 184.8 lb

## 2023-08-26 DIAGNOSIS — R1013 Epigastric pain: Secondary | ICD-10-CM | POA: Insufficient documentation

## 2023-08-26 DIAGNOSIS — K529 Noninfective gastroenteritis and colitis, unspecified: Secondary | ICD-10-CM

## 2023-08-26 DIAGNOSIS — R110 Nausea: Secondary | ICD-10-CM

## 2023-08-26 DIAGNOSIS — R17 Unspecified jaundice: Secondary | ICD-10-CM | POA: Insufficient documentation

## 2023-08-26 DIAGNOSIS — R1011 Right upper quadrant pain: Secondary | ICD-10-CM | POA: Insufficient documentation

## 2023-08-26 DIAGNOSIS — R1310 Dysphagia, unspecified: Secondary | ICD-10-CM

## 2023-08-26 DIAGNOSIS — R1319 Other dysphagia: Secondary | ICD-10-CM | POA: Insufficient documentation

## 2023-08-26 NOTE — H&P (View-Only) (Signed)
 GI Office Note    Referring Provider: Assunta Found, MD Primary Care Physician:  Assunta Found, MD  Primary Gastroenterologist: Hennie Duos. Marletta Lor, DO  Chief Complaint   Chief Complaint  Patient presents with   Abdominal Pain    RUQ side pain that wraps around to her back     History of Present Illness   Kendra Wilson is a 53 y.o. female presenting today at the request of Carmel Sacramento, PA-C (ED provider) for abdominal pain.   Patient reported to EDP, upper abdominal pain with nausea intermittent for several months, worse after meals. Went to ED, after developing symptoms after eating a hotdog.   CT A/P with contrast 08/17/23:  IMPRESSION: -No acute findings. -Colonic diverticulosis, without radiographic evidence of diverticulitis. -Small hiatal hernia.  PCP completed stool test 08/2023: Cdiff and stool culture negative.  Today: Switched from Trulicity sometimes last year, from Victoza. Was having issues getting her medication regularly due to shortages.  Over the past 3 months however she has been on Trulicity consistently.  She is not sure if her symptoms are related to the medication or from diabetes in general.  She was diagnosed with diabetes back in her 46s.  She complains of right upper quadrant/epigastric pain which radiates around to the right side, often occurring after meals, can last off and on all day.  Associated with nausea.  She really has not been able to determine any particular food triggers.  Nothing that she does makes it better.  She feels like she is unable to really get answers as was the source of her symptoms.  She has allergies, peanut allergy, also with her seasonal allergies she has to avoid certain foods which have similar proteins that she reacts to.  So she is opted not sure if she is eating something that makes her symptoms occur.  That her PCP gave her pantoprazole but she did not start it because she did not really have heartburn  symptoms.  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  Ranitidine for a while and then switched over to omeprazole when it was pulled from the market.  Has not been on for a while.  States that she has always had some chronic issues swallowing and was told it was from reflux.  She has never had an endoscopy.  Can have hoarseness off and on.  Often has solid food dysphagia, food can come back up.  Happens a lot with chicken.  Sometimes happens with liquids as well.  Has seen an allergist, had labs in 2004 was told she had a lot of sensitivities.  Later had skin testing states she did not have as many reactions on skin test.  Sometimes ibuprofen 2-3 times per week as needed.   Chronic bowel issues.  Has been on Colestid for suspected diarrhea related to having her gallbladder removed.  Sometimes she does have some constipation and can go a few days without a stool.  Often postprandial loose stools. Can have loose one day and can't go the next day. Can see grease/oil in the toilet after certain foods ([place]).  Typically 3-4 stools a day.Rare nocturnal stools.   Overdue for colonoscopy.  She tried to get 1 done several years ago but did not tolerate trial lightly.  Vomited with the first half and could not keep any of it down.  Colonoscopy 2011: -5mm rectal polyp removed, hyperplastic -diverticulosis -internal hemorrhoids   Medications   Current Outpatient Medications  Medication Sig Dispense Refill   Black Cohosh 540 MG CAPS Take 1 capsule by mouth daily.     cetirizine (ZYRTEC) 10 MG tablet Take 1 tablet by mouth daily.     colestipol (COLESTID) 1 g tablet TAKE 1 TABLET (1 G TOTAL) BY MOUTH 3 (THREE) TIMES DAILY. 90 tablet 2   Dulaglutide (TRULICITY) 0.75 MG/0.5ML SOAJ Inject 0.75 mg into the skin.     glucose blood (ONETOUCH VERIO) test strip Use as directed to check blood  glucose two times daily 200 strip 2   Insulin Pen Needle (B-D ULTRAFINE III SHORT PEN) 31G X 8 MM MISC 1 each by Does not apply route as directed. 100 each 3   Lancets Thin MISC 1 each by Other route 4 (four) times daily. 150 each 5   Omega-3 Fatty Acids (FISH OIL) 1200 MG CAPS Take 1 capsule by mouth daily.     pantoprazole (PROTONIX) 40 MG tablet Take 1 tablet (40 mg total) by mouth daily. (Patient not taking: Reported on 08/26/2023) 30 tablet 0   No current facility-administered medications for this visit.    Allergies   Allergies as of 08/26/2023 - Review Complete 08/26/2023  Allergen Reaction Noted   Codeine Nausea And Vomiting 07/03/2017   Demerol [meperidine]  07/03/2017   Morphine and codeine Nausea And Vomiting 07/03/2017   Peanut allergen powder-dnfp  08/26/2023   Statins Swelling 01/28/2018    Past Medical History   Past Medical History:  Diagnosis Date   Diabetes mellitus without complication (HCC)    Diabetes mellitus, type II (HCC)    History of kidney stones    Hyperlipidemia    PONV (postoperative nausea and vomiting)     Past Surgical History   Past Surgical History:  Procedure Laterality Date   CARPAL TUNNEL RELEASE Left    CATARACT EXTRACTION W/PHACO Left 09/23/2021   Procedure: CATARACT EXTRACTION PHACO AND INTRAOCULAR LENS PLACEMENT (IOC);  Surgeon: Fabio Pierce, MD;  Location: [facility];  Service: Ophthalmology;  Laterality: Left;  CDE: 8.95   CHOLECYSTECTOMY     LITHOTRIPSY  2018   TUBAL LIGATION     WISDOM TOOTH EXTRACTION      Past Family History   Family History  Problem Relation Age of Onset   Colon cancer Neg Hx    Celiac disease Neg Hx    Inflammatory bowel disease Neg Hx     Past Social History   Social History   Socioeconomic History   Marital status: Single    Spouse name: Not on file   Number of children: Not on file   Years of education: Not on file   Highest education level: Not on file  Occupational History   Not on file   Tobacco Use   Smoking status: Never   Smokeless tobacco: Never  Vaping Use   Vaping status: Never Used  Substance and Sexual Activity   Alcohol use: No   Drug use: No   Sexual activity: Yes    Birth control/protection: Surgical  Other Topics Concern   Not on file  Social History Narrative   Not on file   Social Drivers of Health   Financial Resource Strain: Not on file  Food Insecurity: Not on file  Transportation Needs: Not on file  Physical Activity: Not on file  Stress: Not on file  Social Connections: Not on file  Intimate Partner  Violence: Not on file    Review of Systems   General: Negative for anorexia, weight loss, fever, chills, fatigue, weakness. Eyes: Negative for vision changes.  ENT: Negative for nasal congestion. See hpi CV: Negative for chest pain, angina, palpitations, dyspnea on exertion, peripheral edema.  Respiratory: Negative for dyspnea at rest, dyspnea on exertion, cough, sputum, wheezing.  GI: See history of present illness. GU:  Negative for dysuria, hematuria, urinary incontinence, urinary frequency, nocturnal urination.  MS: Negative for joint pain, low back pain.  Derm: Negative for rash or itching.  Neuro: Negative for weakness, abnormal sensation, seizure, frequent headaches, memory loss,  confusion.  Psych: Negative for anxiety, depression, suicidal ideation, hallucinations.  Endo: Negative for unusual weight change.  Heme: Negative for bruising or bleeding. Allergy: Negative for rash or hives.  Physical Exam   BP 108/61 (BP Location: Right Arm, Patient Position: Sitting, Cuff Size: Normal)   Pulse 69   Temp 97.8 F (36.6 C) (Oral)   Ht 5\' 7"  (1.702 m)   Wt 184 lb 12.8 oz (83.8 kg)   LMP 08/31/2017   SpO2 98%   BMI 28.94 kg/m    General: Well-nourished, well-developed in no acute distress.  Head: Normocephalic, atraumatic.   Eyes: Conjunctiva pink, no icterus. Mouth: Oropharyngeal mucosa moist and pink   Neck: Supple without  thyromegaly, masses, or lymphadenopathy.  Lungs: Clear to auscultation bilaterally.  Heart: Regular rate and rhythm, no murmurs rubs or gallops.  Abdomen: Bowel sounds are normal,  nondistended, no hepatosplenomegaly or masses,  no abdominal bruits or hernia, no rebound or guarding.  Ruq pain/epig tenderness Rectal: not performed Extremities: No lower extremity edema. No clubbing or deformities.  Neuro: Alert and oriented x 4 , grossly normal neurologically.  Skin: Warm and dry, no rash or jaundice.   Psych: Alert and cooperative, normal mood and affect.  Labs   Lab Results  Component Value Date   NA 139 08/17/2023   CL 104 08/17/2023   K 3.9 08/17/2023   CO2 24 08/17/2023   BUN 16 08/17/2023   CREATININE 0.91 08/17/2023   GFRNONAA >60 08/17/2023   CALCIUM 9.0 08/17/2023   ALBUMIN 4.0 08/17/2023   GLUCOSE 166 (H) 08/17/2023   Lab Results  Component Value Date   ALT 18 08/17/2023   AST 19 08/17/2023   ALKPHOS 81 08/17/2023   BILITOT 1.3 (H) 08/17/2023   Lab Results  Component Value Date   WBC 6.5 08/17/2023   HGB 14.1 08/17/2023   HCT 41.6 08/17/2023   MCV 92.9 08/17/2023   PLT 191 08/17/2023   Lab Results  Component Value Date   LIPASE 37 08/17/2023    Imaging Studies   CT ABDOMEN PELVIS W CONTRAST Result Date: 08/17/2023 CLINICAL DATA:  Acute abdominal pain. EXAM: CT ABDOMEN AND PELVIS WITH CONTRAST TECHNIQUE: Multidetector CT imaging of the abdomen and pelvis was performed using the standard protocol following bolus administration of intravenous contrast. RADIATION DOSE REDUCTION: This exam was performed according to the departmental dose-optimization program which includes automated exposure control, adjustment of the mA and/or kV according to patient size and/or use of iterative reconstruction technique. CONTRAST:  OMNIPAQUE IOHEXOL 300 MG/ML  SOLN COMPARISON:  None Available. FINDINGS: Lower Chest: No acute findings. Hepatobiliary: No suspicious hepatic  masses identified. Prior cholecystectomy. No evidence of biliary obstruction. Pancreas:  No mass or inflammatory changes. Spleen: Within normal limits in size and appearance. Adrenals/Urinary Tract: No suspicious masses identified. No evidence of ureteral calculi or hydronephrosis. Stomach/Bowel: Small  hiatal hernia noted. No evidence of obstruction, inflammatory process or abnormal fluid collections. Normal appendix visualized. Mild diffuse colonic diverticulosis is seen, without signs of diverticulitis. Vascular/Lymphatic: No pathologically enlarged lymph nodes. No acute vascular findings. Reproductive:  No mass or other significant abnormality. Other:  None. Musculoskeletal:  No suspicious bone lesions identified. IMPRESSION: No acute findings. Colonic diverticulosis, without radiographic evidence of diverticulitis. Small hiatal hernia. Electronically Signed   By: Danae Orleans M.D.   On: 08/17/2023 16:45    Assessment/Plan:   RUQ/epigastric pain/nausea/dysphagia:may be secondary to delay gastric emptying, gastritis, PUD, less likely biliary or malignancy. Dysphagia may be due to peptic stricture but EOE remains in differential. -EGD/ED with Dr. Marletta Lor. ASA 2.  I have discussed the risks, alternatives, benefits with regards to but not limited to the risk of reaction to medication, bleeding, infection, perforation and the patient is agreeable to proceed. Written consent to be obtained. -if EGD not in the next couple of weeks, encouraged her to start PPI as already prescribed  Elevated Tbili: likely insignificant -recheck LFTs    Chronic diarrhea: differential includes IBS, bile salt diarrhea, celiac disease, thyroid dysfunction but given reported fatty stools, we need to also rule out exocrine pancreatic insufficiency -fecal elastaste -TTG IgA, serum IgA -TSH, free T4 -CRP, sed rate  Colonoscopy: overdue for screening, may need to help determine source of chronic diarrhea (pending other work up).  Will work on setting up after current UGI symptoms addressed. She will need pill prep or small volume prep (samples if necessary).   Leanna Battles. Melvyn Neth, MHS, PA-C [facility]

## 2023-08-26 NOTE — Progress Notes (Signed)
 GI Office Note    Referring Provider: Assunta Found, MD Primary Care Physician:  Assunta Found, MD  Primary Gastroenterologist: Hennie Duos. Marletta Lor, DO  Chief Complaint   Chief Complaint  Patient presents with   Abdominal Pain    RUQ side pain that wraps around to her back     History of Present Illness   Kendra Wilson is a 53 y.o. female presenting today at the request of Carmel [place], PA-C (ED provider) for abdominal pain.   Patient reported to EDP, upper abdominal pain with nausea intermittent for several months, worse after meals. Went to ED, after developing symptoms after eating a hotdog.   CT A/P with contrast 08/17/23:  IMPRESSION: -No acute findings. -Colonic diverticulosis, without radiographic evidence of diverticulitis. -Small hiatal hernia.  PCP completed stool test 08/2023: Cdiff and stool culture negative.  Today: Switched from Trulicity sometimes last year, from Victoza. Was having issues getting her medication regularly due to shortages.  Over the past 3 months however she has been on Trulicity consistently.  She is not sure if her symptoms are related to the medication or from diabetes in general.  She was diagnosed with diabetes back in her 46s.  She complains of right upper quadrant/epigastric pain which radiates around to the right side, often occurring after meals, can last off and on all day.  Associated with nausea.  She really has not been able to determine any particular food triggers.  Nothing that she does makes it better.  She feels like she is unable to really get answers as was the source of her symptoms.  She has allergies, peanut allergy, also with her seasonal allergies she has to avoid certain foods which have similar proteins that she reacts to.  So she is opted not sure if she is eating something that makes her symptoms occur.  That her PCP gave her pantoprazole but she did not start it because she did not really have heartburn  symptoms.  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  Ranitidine for a while and then switched over to omeprazole when it was pulled from the market.  Has not been on for a while.  States that she has always had some chronic issues swallowing and was told it was from reflux.  She has never had an endoscopy.  Can have hoarseness off and on.  Often has solid food dysphagia, food can come back up.  Happens a lot with chicken.  Sometimes happens with liquids as well.  Has seen an allergist, had labs in 2004 was told she had a lot of sensitivities.  Later had skin testing states she did not have as many reactions on skin test.  Sometimes ibuprofen 2-3 times per week as needed.   Chronic bowel issues.  Has been on Colestid for suspected diarrhea related to having her gallbladder removed.  Sometimes she does have some constipation and can go a few days without a stool.  Often postprandial loose stools. Can have loose one day and can't go the next day. Can see grease/oil in the toilet after certain foods ([place]).  Typically 3-4 stools a day.Rare nocturnal stools.   Overdue for colonoscopy.  She tried to get 1 done several years ago but did not tolerate trial lightly.  Vomited with the first half and could not keep any of it down.  Colonoscopy 2011: -5mm rectal polyp removed, hyperplastic -diverticulosis -internal hemorrhoids   Medications   Current Outpatient Medications  Medication Sig Dispense Refill   Black Cohosh 540 MG CAPS Take 1 capsule by mouth daily.     cetirizine (ZYRTEC) 10 MG tablet Take 1 tablet by mouth daily.     colestipol (COLESTID) 1 g tablet TAKE 1 TABLET (1 G TOTAL) BY MOUTH 3 (THREE) TIMES DAILY. 90 tablet 2   Dulaglutide (TRULICITY) 0.75 MG/0.5ML SOAJ Inject 0.75 mg into the skin.     glucose blood (ONETOUCH VERIO) test strip Use as directed to check blood  glucose two times daily 200 strip 2   Insulin Pen Needle (B-D ULTRAFINE III SHORT PEN) 31G X 8 MM MISC 1 each by Does not apply route as directed. 100 each 3   Lancets Thin MISC 1 each by Other route 4 (four) times daily. 150 each 5   Omega-3 Fatty Acids (FISH OIL) 1200 MG CAPS Take 1 capsule by mouth daily.     pantoprazole (PROTONIX) 40 MG tablet Take 1 tablet (40 mg total) by mouth daily. (Patient not taking: Reported on 08/26/2023) 30 tablet 0   No current facility-administered medications for this visit.    Allergies   Allergies as of 08/26/2023 - Review Complete 08/26/2023  Allergen Reaction Noted   Codeine Nausea And Vomiting 07/03/2017   Demerol [meperidine]  07/03/2017   Morphine and codeine Nausea And Vomiting 07/03/2017   Peanut allergen powder-dnfp  08/26/2023   Statins Swelling 01/28/2018    Past Medical History   Past Medical History:  Diagnosis Date   Diabetes mellitus without complication (HCC)    Diabetes mellitus, type II (HCC)    History of kidney stones    Hyperlipidemia    PONV (postoperative nausea and vomiting)     Past Surgical History   Past Surgical History:  Procedure Laterality Date   CARPAL TUNNEL RELEASE Left    CATARACT EXTRACTION W/PHACO Left 09/23/2021   Procedure: CATARACT EXTRACTION PHACO AND INTRAOCULAR LENS PLACEMENT (IOC);  Surgeon: Fabio Pierce, MD;  Location: [facility];  Service: Ophthalmology;  Laterality: Left;  CDE: 8.95   CHOLECYSTECTOMY     LITHOTRIPSY  2018   TUBAL LIGATION     WISDOM TOOTH EXTRACTION      Past Family History   Family History  Problem Relation Age of Onset   Colon cancer Neg Hx    Celiac disease Neg Hx    Inflammatory bowel disease Neg Hx     Past Social History   Social History   Socioeconomic History   Marital status: Single    Spouse name: Not on file   Number of children: Not on file   Years of education: Not on file   Highest education level: Not on file  Occupational History   Not on file   Tobacco Use   Smoking status: Never   Smokeless tobacco: Never  Vaping Use   Vaping status: Never Used  Substance and Sexual Activity   Alcohol use: No   Drug use: No   Sexual activity: Yes    Birth control/protection: Surgical  Other Topics Concern   Not on file  Social History Narrative   Not on file   Social Drivers of Health   Financial Resource Strain: Not on file  Food Insecurity: Not on file  Transportation Needs: Not on file  Physical Activity: Not on file  Stress: Not on file  Social Connections: Not on file  Intimate Partner  Violence: Not on file    Review of Systems   General: Negative for anorexia, weight loss, fever, chills, fatigue, weakness. Eyes: Negative for vision changes.  ENT: Negative for nasal congestion. See hpi CV: Negative for chest pain, angina, palpitations, dyspnea on exertion, peripheral edema.  Respiratory: Negative for dyspnea at rest, dyspnea on exertion, cough, sputum, wheezing.  GI: See history of present illness. GU:  Negative for dysuria, hematuria, urinary incontinence, urinary frequency, nocturnal urination.  MS: Negative for joint pain, low back pain.  Derm: Negative for rash or itching.  Neuro: Negative for weakness, abnormal sensation, seizure, frequent headaches, memory loss,  confusion.  Psych: Negative for anxiety, depression, suicidal ideation, hallucinations.  Endo: Negative for unusual weight change.  Heme: Negative for bruising or bleeding. Allergy: Negative for rash or hives.  Physical Exam   BP 108/61 (BP Location: Right Arm, Patient Position: Sitting, Cuff Size: Normal)   Pulse 69   Temp 97.8 F (36.6 C) (Oral)   Ht 5\' 7"  (1.702 m)   Wt 184 lb 12.8 oz (83.8 kg)   LMP 08/31/2017   SpO2 98%   BMI 28.94 kg/m    General: Well-nourished, well-developed in no acute distress.  Head: Normocephalic, atraumatic.   Eyes: Conjunctiva pink, no icterus. Mouth: Oropharyngeal mucosa moist and pink   Neck: Supple without  thyromegaly, masses, or lymphadenopathy.  Lungs: Clear to auscultation bilaterally.  Heart: Regular rate and rhythm, no murmurs rubs or gallops.  Abdomen: Bowel sounds are normal,  nondistended, no hepatosplenomegaly or masses,  no abdominal bruits or hernia, no rebound or guarding.  Ruq pain/epig tenderness Rectal: not performed Extremities: No lower extremity edema. No clubbing or deformities.  Neuro: Alert and oriented x 4 , grossly normal neurologically.  Skin: Warm and dry, no rash or jaundice.   Psych: Alert and cooperative, normal mood and affect.  Labs   Lab Results  Component Value Date   NA 139 08/17/2023   CL 104 08/17/2023   K 3.9 08/17/2023   CO2 24 08/17/2023   BUN 16 08/17/2023   CREATININE 0.91 08/17/2023   GFRNONAA >60 08/17/2023   CALCIUM 9.0 08/17/2023   ALBUMIN 4.0 08/17/2023   GLUCOSE 166 (H) 08/17/2023   Lab Results  Component Value Date   ALT 18 08/17/2023   AST 19 08/17/2023   ALKPHOS 81 08/17/2023   BILITOT 1.3 (H) 08/17/2023   Lab Results  Component Value Date   WBC 6.5 08/17/2023   HGB 14.1 08/17/2023   HCT 41.6 08/17/2023   MCV 92.9 08/17/2023   PLT 191 08/17/2023   Lab Results  Component Value Date   LIPASE 37 08/17/2023    Imaging Studies   CT ABDOMEN PELVIS W CONTRAST Result Date: 08/17/2023 CLINICAL DATA:  Acute abdominal pain. EXAM: CT ABDOMEN AND PELVIS WITH CONTRAST TECHNIQUE: Multidetector CT imaging of the abdomen and pelvis was performed using the standard protocol following bolus administration of intravenous contrast. RADIATION DOSE REDUCTION: This exam was performed according to the departmental dose-optimization program which includes automated exposure control, adjustment of the mA and/or kV according to patient size and/or use of iterative reconstruction technique. CONTRAST:  OMNIPAQUE IOHEXOL 300 MG/ML  SOLN COMPARISON:  None Available. FINDINGS: Lower Chest: No acute findings. Hepatobiliary: No suspicious hepatic  masses identified. Prior cholecystectomy. No evidence of biliary obstruction. Pancreas:  No mass or inflammatory changes. Spleen: Within normal limits in size and appearance. Adrenals/Urinary Tract: No suspicious masses identified. No evidence of ureteral calculi or hydronephrosis. Stomach/Bowel: Small  hiatal hernia noted. No evidence of obstruction, inflammatory process or abnormal fluid collections. Normal appendix visualized. Mild diffuse colonic diverticulosis is seen, without signs of diverticulitis. Vascular/Lymphatic: No pathologically enlarged lymph nodes. No acute vascular findings. Reproductive:  No mass or other significant abnormality. Other:  None. Musculoskeletal:  No suspicious bone lesions identified. IMPRESSION: No acute findings. Colonic diverticulosis, without radiographic evidence of diverticulitis. Small hiatal hernia. Electronically Signed   By: Danae Orleans M.D.   On: 08/17/2023 16:45    Assessment/Plan:   RUQ/epigastric pain/nausea/dysphagia:may be secondary to delay gastric emptying, gastritis, PUD, less likely biliary or malignancy. Dysphagia may be due to peptic stricture but EOE remains in differential. -EGD/ED with Dr. Marletta Lor. ASA 2.  I have discussed the risks, alternatives, benefits with regards to but not limited to the risk of reaction to medication, bleeding, infection, perforation and the patient is agreeable to proceed. Written consent to be obtained. -if EGD not in the next couple of weeks, encouraged her to start PPI as already prescribed  Elevated Tbili: likely insignificant -recheck LFTs    Chronic diarrhea: differential includes IBS, bile salt diarrhea, celiac disease, thyroid dysfunction but given reported fatty stools, we need to also rule out exocrine pancreatic insufficiency -fecal elastaste -TTG IgA, serum IgA -TSH, free T4 -CRP, sed rate  Colonoscopy: overdue for screening, may need to help determine source of chronic diarrhea (pending other work up).  Will work on setting up after current UGI symptoms addressed. She will need pill prep or small volume prep (samples if necessary).   Leanna Battles. Melvyn Neth, MHS, PA-C [facility]

## 2023-08-26 NOTE — Telephone Encounter (Signed)
 Pt called back and needed to reschedule her procedure from 09/22/23 due to transportation issues. She has been rescheduled to 09/01/23 at 1:15 pm.  Updated instructions sent via mychart.

## 2023-08-26 NOTE — Patient Instructions (Signed)
 Upper endoscopy planned to evaluate your abdominal pain, nausea, swallowing issues.  Complete labs at any time but at least one week before your upper endoscopy. Complete stools.  If you upper endoscopy is not in the next couple of weeks, please start pantoprazole once daily before your first meal of the day. We will work towards a colonoscopy later after you are feeling better.

## 2023-08-29 LAB — PANCREATIC ELASTASE, FECAL: Pancreatic Elastase, Fecal: >800 ug Elast./g (ref >200)

## 2023-08-30 LAB — HEPATIC FUNCTION PANEL
ALT: 18 IU/L (ref 0–32)
AST: 20 IU/L (ref 0–40)
Albumin: 4.2 g/dL (ref 3.8–4.9)
Alkaline Phosphatase: 86 IU/L (ref 44–121)
Bilirubin Total: 0.6 mg/dL (ref 0.0–1.2)
Bilirubin, Direct: 0.19 mg/dL (ref 0.00–0.40)
Total Protein: 6.6 g/dL (ref 6.0–8.5)

## 2023-08-30 LAB — TSH+FREE T4
Free T4: 1.2 ng/dL (ref 0.82–1.77)
TSH: 1.46 u[IU]/mL (ref 0.450–4.500)

## 2023-08-30 LAB — C-REACTIVE PROTEIN: CRP: 3 mg/L (ref 0–10)

## 2023-08-30 LAB — SEDIMENTATION RATE: Sed Rate: 8 mm/h (ref 0–40)

## 2023-08-30 LAB — TISSUE TRANSGLUTAMINASE, IGA: Transglutaminase IgA: <2 U/mL (ref 0–3)

## 2023-08-30 LAB — IGA: IgA/Immunoglobulin A, Serum: 297 mg/dL (ref 87–352)

## 2023-09-01 ENCOUNTER — Ambulatory Visit (HOSPITAL_COMMUNITY)
Admission: RE | Admit: 2023-09-01 | Discharge: 2023-09-01 | Disposition: A | Discharge status: Home / Self Care | Attending: Internal Medicine | Admitting: Internal Medicine

## 2023-09-01 ENCOUNTER — Encounter (HOSPITAL_COMMUNITY): Payer: Self-pay | Admitting: Internal Medicine

## 2023-09-01 ENCOUNTER — Other Ambulatory Visit: Payer: Self-pay

## 2023-09-01 ENCOUNTER — Encounter (HOSPITAL_COMMUNITY)
Admission: RE | Disposition: A | Discharge status: Home / Self Care | Payer: Self-pay | Source: Home / Self Care | Attending: Internal Medicine

## 2023-09-01 ENCOUNTER — Ambulatory Visit (HOSPITAL_BASED_OUTPATIENT_CLINIC_OR_DEPARTMENT_OTHER): Admitting: Anesthesiology

## 2023-09-01 ENCOUNTER — Ambulatory Visit (HOSPITAL_COMMUNITY): Admitting: Anesthesiology

## 2023-09-01 DIAGNOSIS — E119 Type 2 diabetes mellitus without complications: Secondary | ICD-10-CM

## 2023-09-01 DIAGNOSIS — K222 Esophageal obstruction: Secondary | ICD-10-CM | POA: Insufficient documentation

## 2023-09-01 DIAGNOSIS — Z794 Long term (current) use of insulin: Secondary | ICD-10-CM | POA: Diagnosis not present

## 2023-09-01 DIAGNOSIS — R1310 Dysphagia, unspecified: Secondary | ICD-10-CM

## 2023-09-01 DIAGNOSIS — K2970 Gastritis, unspecified, without bleeding: Secondary | ICD-10-CM | POA: Diagnosis not present

## 2023-09-01 DIAGNOSIS — K449 Diaphragmatic hernia without obstruction or gangrene: Secondary | ICD-10-CM | POA: Insufficient documentation

## 2023-09-01 DIAGNOSIS — K2950 Unspecified chronic gastritis without bleeding: Secondary | ICD-10-CM | POA: Insufficient documentation

## 2023-09-01 DIAGNOSIS — Z7985 Long-term (current) use of injectable non-insulin antidiabetic drugs: Secondary | ICD-10-CM | POA: Insufficient documentation

## 2023-09-01 DIAGNOSIS — R1011 Right upper quadrant pain: Secondary | ICD-10-CM | POA: Diagnosis present

## 2023-09-01 HISTORY — PX: ESOPHAGEAL DILATION: SHX303

## 2023-09-01 HISTORY — PX: ESOPHAGOGASTRODUODENOSCOPY: SHX5428

## 2023-09-01 LAB — GLUCOSE, CAPILLARY
Glucose-Capillary: 108 mg/dL — ABNORMAL HIGH (ref 70–99)
Glucose-Capillary: 94 mg/dL (ref 70–99)

## 2023-09-01 SURGERY — EGD (ESOPHAGOGASTRODUODENOSCOPY)
Anesthesia: General

## 2023-09-01 MED ORDER — SODIUM CHLORIDE 0.9% FLUSH: 3.0000 mL | INTRAVENOUS | Status: DC | PRN

## 2023-09-01 MED ORDER — LACTATED RINGERS IV SOLN
INTRAVENOUS | Status: DC | PRN
Start: 2023-09-01 — End: 2023-09-01

## 2023-09-01 MED ORDER — PROPOFOL 10 MG/ML IV BOLUS
INTRAVENOUS | Status: DC | PRN
  Administered 2023-09-01: 200 mg via INTRAVENOUS
  Administered 2023-09-01: 30 mg via INTRAVENOUS

## 2023-09-01 MED ORDER — SODIUM CHLORIDE 0.9% FLUSH
3.0000 mL | Freq: Two times a day (BID) | INTRAVENOUS | Status: DC
  Administered 2023-09-01: 10 mL via INTRAVENOUS

## 2023-09-01 MED ORDER — ONDANSETRON HCL 4 MG/2ML IJ SOLN
4.0000 mg | Freq: Once | INTRAMUSCULAR | Status: AC
  Administered 2023-09-01: 4 mg via INTRAVENOUS

## 2023-09-01 MED ORDER — SODIUM CHLORIDE FLUSH 0.9 % IV SOLN
INTRAVENOUS | Status: AC
  Filled 2023-09-01: qty 10

## 2023-09-01 MED ORDER — LIDOCAINE HCL (CARDIAC) PF 100 MG/5ML IV SOSY
PREFILLED_SYRINGE | INTRAVENOUS | Status: DC | PRN
  Administered 2023-09-01: 100 mg via INTRATRACHEAL

## 2023-09-01 MED ORDER — PANTOPRAZOLE SODIUM 40 MG PO TBEC
40.0000 mg | DELAYED_RELEASE_TABLET | Freq: Two times a day (BID) | ORAL | 11 refills | Status: DC
Start: 2023-09-01 — End: 2023-11-02

## 2023-09-01 MED ORDER — SODIUM CHLORIDE 0.9 % IV SOLN
12.5000 mg | Freq: Once | INTRAVENOUS | Status: DC
  Filled 2023-09-01: qty 0.5

## 2023-09-01 MED ORDER — SODIUM CHLORIDE 0.9% FLUSH: 3.0000 mL | Freq: Two times a day (BID) | INTRAVENOUS | Status: DC

## 2023-09-01 NOTE — Anesthesia Preprocedure Evaluation (Addendum)
 Anesthesia Evaluation  Patient identified by MRN, date of birth, ID band Patient awake    Reviewed: Allergy & Precautions, H&P , NPO status , Patient's Chart, lab work & pertinent test results, reviewed documented beta blocker date and time   History of Anesthesia Complications (+) PONV and history of anesthetic complications  Airway Mallampati: II  TM Distance: >3 FB Neck ROM: full    Dental no notable dental hx. (+) Dental Advisory Given, Teeth Intact   Pulmonary neg pulmonary ROS   Pulmonary exam normal breath sounds clear to auscultation       Cardiovascular Exercise Tolerance: Good negative cardio ROS Normal cardiovascular exam Rhythm:regular Rate:Normal     Neuro/Psych Horner's syndrome resolved  negative psych ROS   GI/Hepatic negative GI ROS, Neg liver ROS,,,  Endo/Other  diabetes, Type 2    Renal/GU negative Renal ROS  negative genitourinary   Musculoskeletal   Abdominal   Peds  Hematology negative hematology ROS (+)   Anesthesia Other Findings   Reproductive/Obstetrics negative OB ROS                             Anesthesia Physical Anesthesia Plan  ASA: 2  Anesthesia Plan: General   Post-op Pain Management: Minimal or no pain anticipated   Induction: Intravenous  PONV Risk Score and Plan: Propofol infusion  Airway Management Planned: Natural Airway and Nasal Cannula  Additional Equipment: None  Intra-op Plan:   Post-operative Plan:   Informed Consent: I have reviewed the patients History and Physical, chart, labs and discussed the procedure including the risks, benefits and alternatives for the proposed anesthesia with the patient or authorized representative who has indicated his/her understanding and acceptance.     Dental Advisory Given  Plan Discussed with: CRNA  Anesthesia Plan Comments:         Anesthesia Quick Evaluation

## 2023-09-01 NOTE — Anesthesia Postprocedure Evaluation (Signed)
 Anesthesia Post Note  Patient: Kendra Wilson  Procedure(s) Performed: EGD (ESOPHAGOGASTRODUODENOSCOPY) DILATION, ESOPHAGUS  Patient location during evaluation: PACU Anesthesia Type: General Level of consciousness: awake and alert Pain management: pain level controlled Vital Signs Assessment: post-procedure vital signs reviewed and stable Respiratory status: spontaneous breathing, nonlabored ventilation, respiratory function stable and patient connected to nasal cannula oxygen Cardiovascular status: blood pressure returned to baseline and stable Postop Assessment: no apparent nausea or vomiting Anesthetic complications: no   There were no known notable events for this encounter.   Last Vitals:  Vitals:   09/01/23 1319 09/01/23 1322  BP: (!) 76/36 (!) 95/54  Pulse: 78 84  Resp: 18 20  Temp: 36.4 C   SpO2: 94% 96%    Last Pain:  Vitals:   09/01/23 1320  TempSrc:   PainSc: 0-No pain                 [name] [name]

## 2023-09-01 NOTE — Discharge Instructions (Addendum)
 EGD Discharge instructions Please read the instructions outlined below and refer to this sheet in the next few weeks. These discharge instructions provide you with general information on caring for yourself after you leave the hospital. Your doctor may also give you specific instructions. While your treatment has been planned according to the most current medical practices available, unavoidable complications occasionally occur. If you have any problems or questions after discharge, please call your doctor. ACTIVITY You may resume your regular activity but move at a slower pace for the next 24 hours.  Take frequent rest periods for the next 24 hours.  Walking will help expel (get rid of) the air and reduce the bloated feeling in your abdomen.  No driving for 24 hours (because of the anesthesia (medicine) used during the test).  You may shower.  Do not sign any important legal documents or operate any machinery for 24 hours (because of the anesthesia used during the test).  NUTRITION Drink plenty of fluids.  You may resume your normal diet.  Begin with a light meal and progress to your normal diet.  Avoid alcoholic beverages for 24 hours or as instructed by your caregiver.  MEDICATIONS You may resume your normal medications unless your caregiver tells you otherwise.  WHAT YOU CAN EXPECT TODAY You may experience abdominal discomfort such as a feeling of fullness or "gas" pains.  FOLLOW-UP Your doctor will discuss the results of your test with you.  SEEK IMMEDIATE MEDICAL ATTENTION IF ANY OF THE FOLLOWING OCCUR: Excessive nausea (feeling sick to your stomach) and/or vomiting.  Severe abdominal pain and distention (swelling).  Trouble swallowing.  Temperature over 101 F (37.8 C).  Rectal bleeding or vomiting of blood.   Your EGD revealed mild amount inflammation in your stomach.  I took biopsies of this to rule out infection with a bacteria called H. pylori.  Await pathology results, my  office will contact you.  You also have a small hiatal hernia as well as a tightening of your esophagus called a Schatzki's ring.  This was moderate in severity.  I stretched this out today.  Hopefully this helps with feeling of food getting stuck.  I am going to increase your pantoprazole to twice daily. We may need to perform further dilations in the future   Small bowel appeared normal.  Follow-up in 6 weeks. Office will contact you.   I hope you have a great rest of your week!  Hennie Duos. Marletta Lor, D.O. Gastroenterology and Hepatology [facility]

## 2023-09-01 NOTE — Anesthesia Procedure Notes (Signed)
 Date/Time: 09/01/2023 1:00 PM  Performed by: Franco Nones, CRNAPre-anesthesia Checklist: Patient identified, Emergency Drugs available, Suction available, Timeout performed and Patient being monitored Patient Re-evaluated:Patient Re-evaluated prior to induction Oxygen Delivery Method: Nasal Cannula

## 2023-09-01 NOTE — Interval H&P Note (Signed)
 History and Physical Interval Note:  09/01/2023 12:53 PM  Kendra Wilson  has presented today for surgery, with the diagnosis of dysphagia,RUQ/epigastric pain.  The various methods of treatment have been discussed with the patient and family. After consideration of risks, benefits and other options for treatment, the patient has consented to  Procedure(s) with comments: EGD (ESOPHAGOGASTRODUODENOSCOPY) (N/A) - 10:15 am, asa 2 DILATION, ESOPHAGUS (N/A) as a surgical intervention.  The patient's history has been reviewed, patient examined, no change in status, stable for surgery.  I have reviewed the patient's chart and labs.  Questions were answered to the patient's satisfaction.     [name]

## 2023-09-01 NOTE — OR Nursing (Signed)
 Pt able to tolerate ginger ale without any vomiting. Pt reports she is ready to go.

## 2023-09-01 NOTE — Transfer of Care (Signed)
 Immediate Anesthesia Transfer of Care Note  Patient: Kendra Wilson  Procedure(s) Performed: EGD (ESOPHAGOGASTRODUODENOSCOPY) DILATION, ESOPHAGUS  Patient Location: Endoscopy Unit  Anesthesia Type:General  Shann Medal of Consciousness: drowsy and patient cooperative  Airway & Oxygen Therapy: Patient Spontanous Breathing  Post-op Assessment: Report given to RN and Post -op Vital signs reviewed and stable  Post vital signs: Reviewed and stable  Last Vitals:  Vitals Value Taken Time  BP 76/36 09/01/23 1319  Temp 36.4 C 09/01/23 1319  Pulse 78 09/01/23 1319  Resp 18 09/01/23 1319  SpO2 94 % 09/01/23 1319   95/54 b/p at 1323 Last Pain:  Vitals:   09/01/23 1320  TempSrc:   PainSc: 0-No pain      Patients Stated Pain Goal: 6 (09/01/23 1212)  Complications: No notable events documented.

## 2023-09-01 NOTE — Op Note (Signed)
 [facility] Patient Name: Kendra Wilson Procedure Date: 09/01/2023 12:49 PM MRN: [id number] Date of Birth: 1971/05/20 Attending MD: Hennie Duos. Marletta Lor , [place], [id number] CSN: [id number] Age: 53 Admit Type: Outpatient Procedure:                Upper GI endoscopy Indications:              Abdominal pain in the right upper quadrant,                            Dysphagia Providers:                Hennie Duos. Marletta Lor, DO, Francoise Ceo RN, RN, Lennice Sites Technician, Technician Referring MD:              Medicines:                See the Anesthesia note for documentation of the                            administered medications Complications:            No immediate complications. Estimated Blood Loss:     Estimated blood loss was minimal. Procedure:                Pre-Anesthesia Assessment:                           - The anesthesia plan was to use monitored                            anesthesia care (MAC).                           After obtaining informed consent, the endoscope was                            passed under direct vision. Throughout the                            procedure, the patient's blood pressure, pulse, and                            oxygen saturations were monitored continuously. The                            GIF-H190 ([id number]) scope was introduced through the                            mouth, and advanced to the second part of duodenum.                            The upper GI endoscopy was accomplished without                            difficulty. The patient  tolerated the procedure                            well. Scope In: 1:07:04 PM Scope Out: 1:13:06 PM Total Procedure Duration: 0 hours 6 minutes 2 seconds  Findings:      A 2 cm hiatal hernia was present.      A moderate Schatzki ring was found in the distal esophagus. A TTS       dilator was passed through the scope. Dilation with a 12-13.5-15 mm       balloon dilator was  performed to 15 mm. The dilation site was examined       and showed mild mucosal disruption and moderate improvement in luminal       narrowing.      Patchy mild inflammation characterized by erythema was found in the       gastric body and in the gastric antrum. Biopsies were taken with a cold       forceps for Helicobacter pylori testing.      The duodenal bulb, first portion of the duodenum and second portion of       the duodenum were normal. Impression:               - 2 cm hiatal hernia.                           - Moderate Schatzki ring. Dilated.                           - Gastritis. Biopsied.                           - Normal duodenal bulb, first portion of the                            duodenum and second portion of the duodenum. Moderate Sedation:      Per Anesthesia Care Recommendation:           - Patient has a contact number available for                            emergencies. The signs and symptoms of potential                            delayed complications were discussed with the                            patient. Return to normal activities tomorrow.                            Written discharge instructions were provided to the                            patient.                           - Resume previous diet.                           -  Continue present medications.                           - Await pathology results.                           - Repeat upper endoscopy PRN for retreatment.                           - Return to GI clinic in 6 weeks.                           - Use a proton pump inhibitor PO BID. Procedure Code(s):        --- Professional ---                           [phone], Esophagogastroduodenoscopy, flexible,                            transoral; with transendoscopic balloon dilation of                            esophagus (less than 30 mm diameter)                           43239, 59, Esophagogastroduodenoscopy, flexible,                             transoral; with biopsy, single or multiple Diagnosis Code(s):        --- Professional ---                           K44.9, Diaphragmatic hernia without obstruction or                            gangrene                           K22.2, Esophageal obstruction                           K29.70, Gastritis, unspecified, without bleeding                           R10.11, Right upper quadrant pain                           R13.10, Dysphagia, unspecified CPT copyright 2022 American Medical Association. All rights reserved. The codes documented in this report are preliminary and upon coder review may  be revised to meet current compliance requirements. Hennie Duos. Marletta Lor, DO Hennie Duos. Marletta Lor, DO 09/01/2023 1:16:04 PM This report has been signed electronically. Number of Addenda: 0

## 2023-09-01 NOTE — OR Nursing (Signed)
 After peripheral IV removal, pt began to cough followed by a large amount of coffee ground emesis. After a couple of minutes, emesis became bile colored. Offered peppermint on a gauze for patient to smell. Contacted Dr. Leta Jungling. New peripheral IV obtained and orders provided. Zofran IV was administered. Will reassess patient.

## 2023-09-02 ENCOUNTER — Encounter (HOSPITAL_COMMUNITY): Payer: Self-pay | Admitting: Internal Medicine

## 2023-09-02 LAB — SURGICAL PATHOLOGY

## 2023-09-30 IMAGING — MG MM DIGITAL SCREENING BILAT W/ TOMO AND CAD
6 of 10 series · 6 of 30 positions shown · non-contrast
Comparison: Previous exam(s).

CLINICAL DATA: Screening.

EXAM:
DIGITAL SCREENING BILATERAL MAMMOGRAM WITH TOMOSYNTHESIS AND CAD
TECHNIQUE: Bilateral screening digital craniocaudal and mediolateral oblique
mammograms were obtained. Bilateral screening digital breast
tomosynthesis was performed. The images were evaluated with
computer-aided detection.

[L CC synth-2D (1 of 2)]
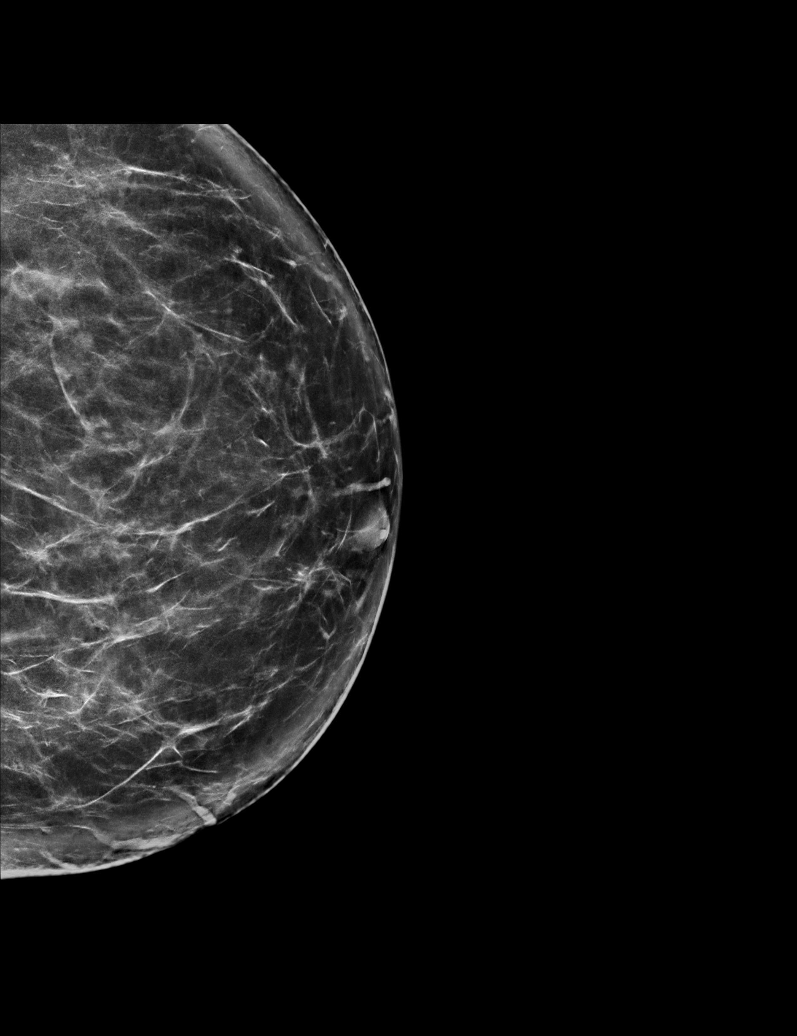

[L CC synth-2D (2 of 2)]
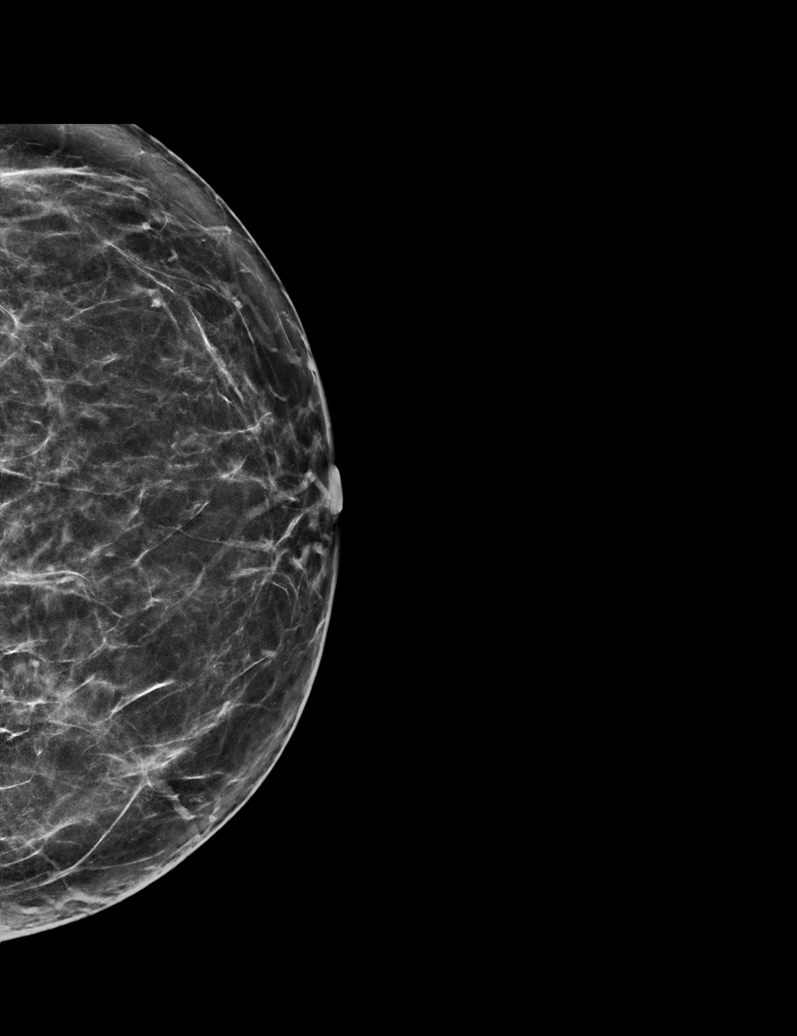

[R MLO synth-2D]
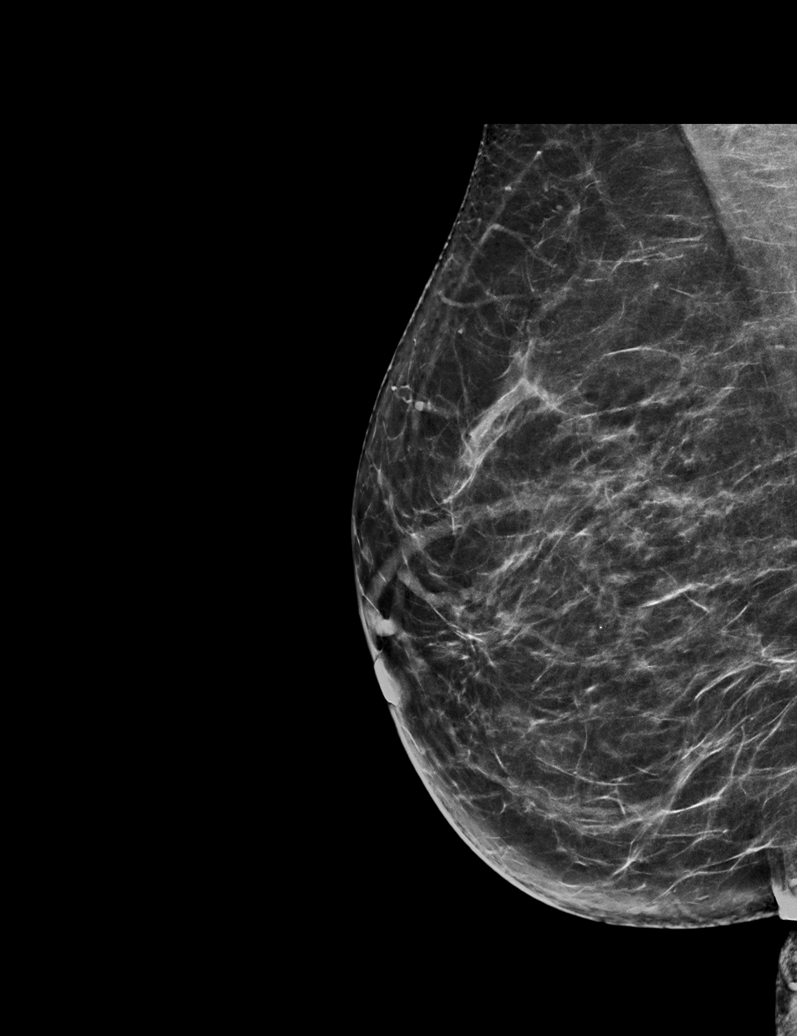

[R CC synth-2D]
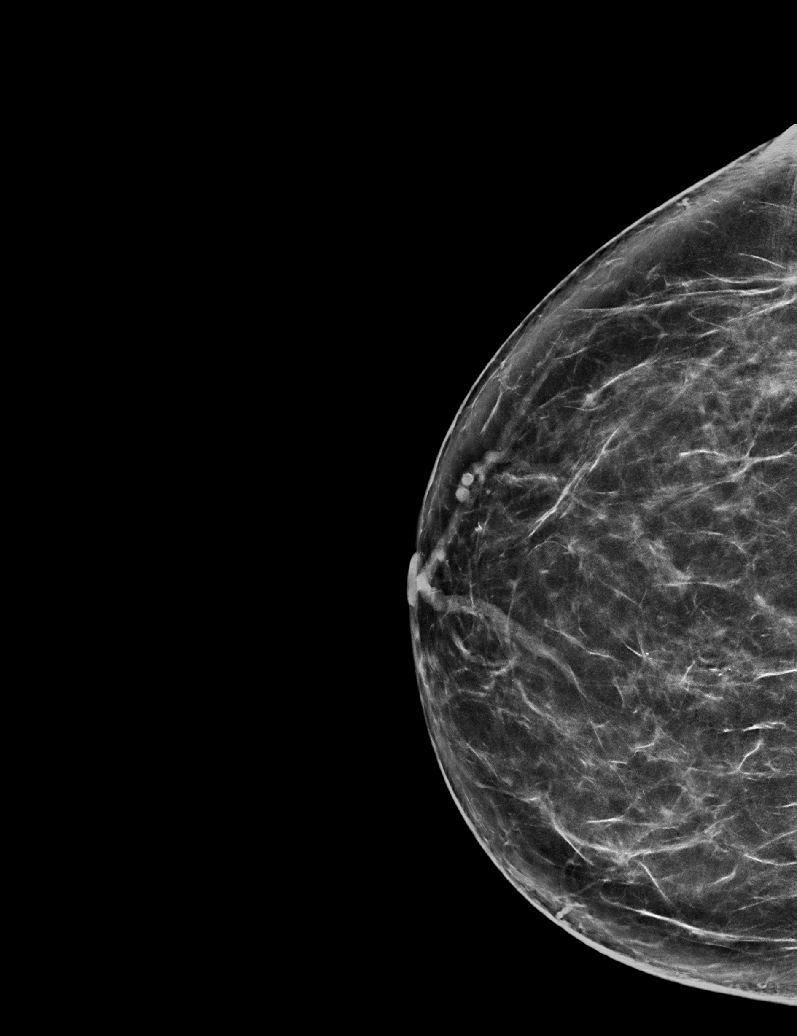

[L MLO synth-2D]
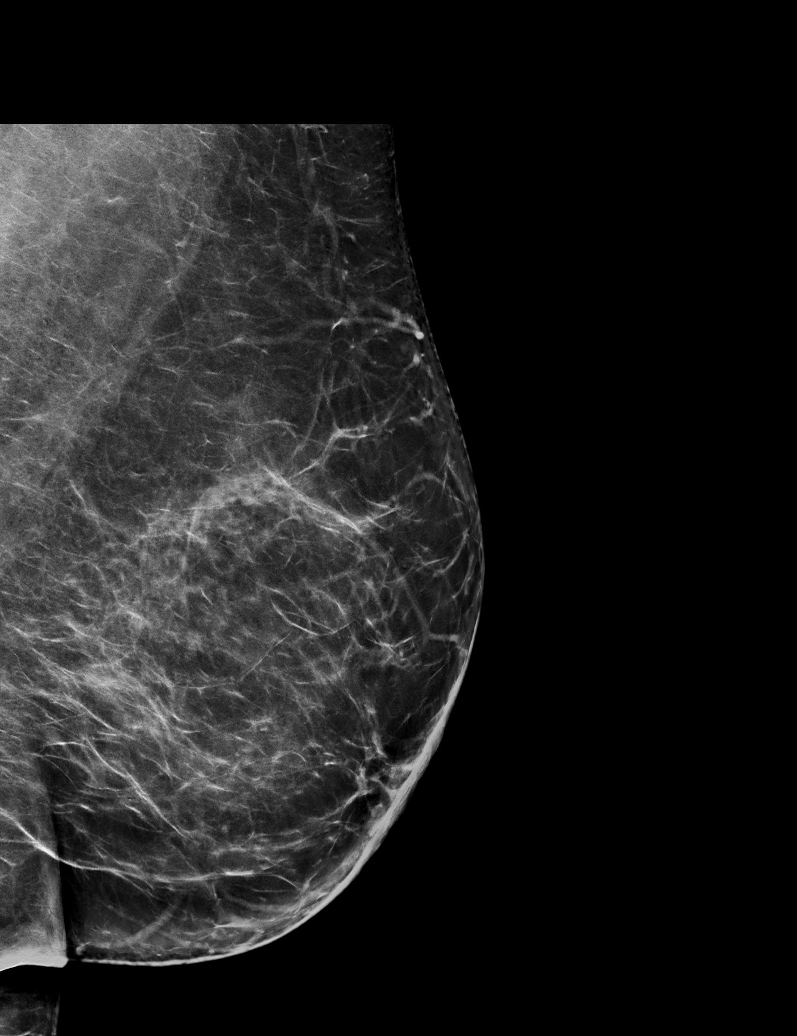

[L CC tomo · tomo slice 39/78.0]
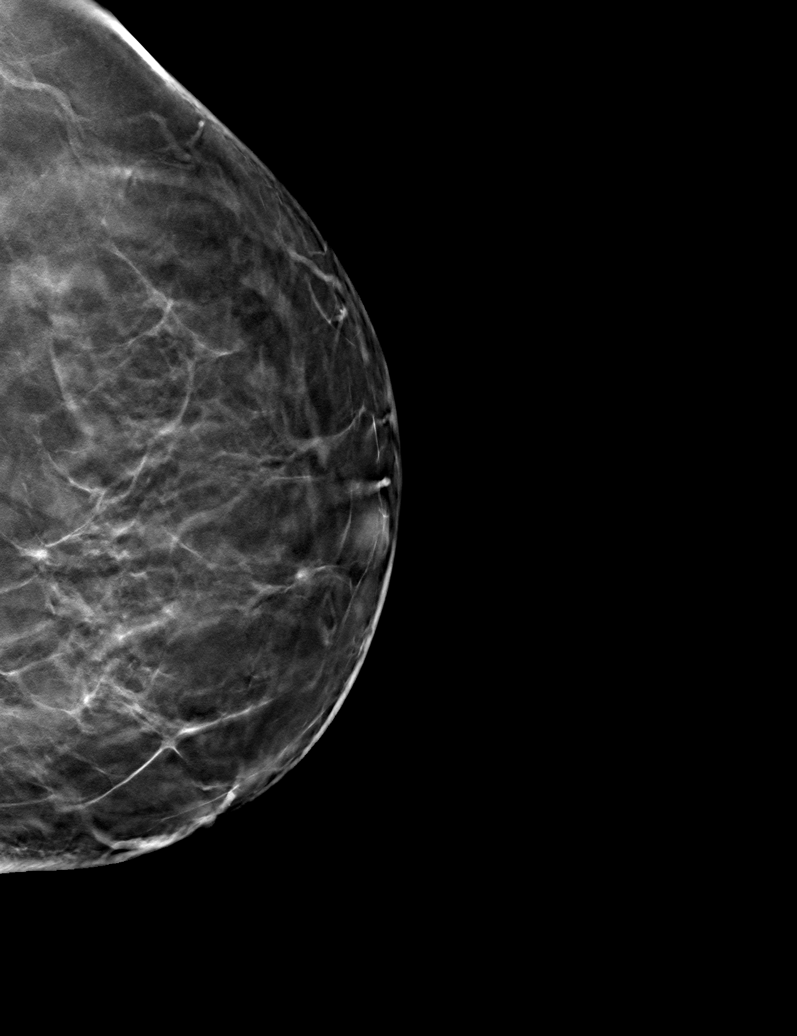

[6 of 30 positions shown; findings below may reference images not displayed]

ACR Breast Density Category b: There are scattered areas of
fibroglandular density.
FINDINGS: There are no findings suspicious for malignancy.
IMPRESSION: No mammographic evidence of malignancy. A result letter of this
screening mammogram will be mailed directly to the patient.

RECOMMENDATION:
Screening mammogram in one year. (Code:51-O-LD2)

BI-RADS CATEGORY  1: Negative.

## 2023-10-05 ENCOUNTER — Ambulatory Visit (HOSPITAL_COMMUNITY)
Admission: RE | Admit: 2023-10-05 | Discharge: 2023-10-05 | Disposition: A | Discharge status: Home / Self Care | Source: Ambulatory Visit | Attending: Nurse Practitioner | Admitting: Nurse Practitioner

## 2023-10-05 ENCOUNTER — Ambulatory Visit
Admission: EM | Admit: 2023-10-05 | Discharge: 2023-10-05 | Disposition: A | Discharge status: Home / Self Care | Attending: Nurse Practitioner | Admitting: Nurse Practitioner

## 2023-10-05 DIAGNOSIS — W2203XA Walked into furniture, initial encounter: Secondary | ICD-10-CM | POA: Insufficient documentation

## 2023-10-05 DIAGNOSIS — M79675 Pain in left toe(s): Secondary | ICD-10-CM | POA: Insufficient documentation

## 2023-10-05 DIAGNOSIS — M7989 Other specified soft tissue disorders: Secondary | ICD-10-CM | POA: Insufficient documentation

## 2023-10-05 DIAGNOSIS — S99922A Unspecified injury of left foot, initial encounter: Secondary | ICD-10-CM | POA: Diagnosis present

## 2023-10-05 NOTE — Discharge Instructions (Addendum)
 Go to [facility] emergency department for an x-ray of the left foot.  You will be contacted only if the results of the x-ray are abnormal. Wear the postop shoe when you are engaged in prolonged or strenuous activity. RICE therapy, rest, ice, compression, and elevation.  Apply ice for 20 minutes, remove for 1 hour, repeat is much as possible to help with pain or swelling. Wear shoes with good insole and support. Follow-up with orthopedics if your x-ray is negative and you continue to experience pain in the left foot/toes after 4 to 5 weeks. Follow-up as needed.

## 2023-10-05 NOTE — ED Provider Notes (Addendum)
 [facility]    CSN: [id number] Arrival date & time: 10/05/23  1704      History   Chief Complaint No chief complaint on file.   HPI Kendra Wilson is a 53 y.o. female.   The history is provided by the patient.   Patient presents for complaints of pain and swelling to the 4th and 5th toes on the left foot.  Patient states symptoms started after she hit her toes on furniture at home 1 day ago.  She states that she has noticed that the toes are "tingly."  She also states that the toes look bruised.  She denies any inability to bear weight or radiation of pain.  Patient states that she did take over-the-counter analgesics and elevated the foot today.  Patient with underlying history of diabetes, reports last A1c was 6.1 or 6.2. 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   Presbyopia 08/19/2011    Past Surgical History:  Procedure Laterality Date   CARPAL TUNNEL RELEASE Left    CATARACT EXTRACTION W/PHACO Left 09/23/2021   Procedure: CATARACT EXTRACTION PHACO AND INTRAOCULAR LENS PLACEMENT (IOC);  Surgeon: Tarri Farm, MD;  Location: [facility];  Service: Ophthalmology;  Laterality: Left;  CDE: 8.95   CHOLECYSTECTOMY     ESOPHAGEAL DILATION N/A 09/01/2023   Procedure: DILATION, ESOPHAGUS;  Surgeon: Vinetta Greening, DO;  Location: [facility];  Service: Endoscopy;   Laterality: N/A;   ESOPHAGOGASTRODUODENOSCOPY N/A 09/01/2023   Procedure: EGD (ESOPHAGOGASTRODUODENOSCOPY);  Surgeon: Vinetta Greening, DO;  Location: [facility];  Service: Endoscopy;  Laterality: N/A;  10:15 am, asa 2   LITHOTRIPSY  2018   TUBAL LIGATION     WISDOM TOOTH EXTRACTION      OB History   No obstetric history on file.      Home Medications    Prior to Admission medications   Medication Sig Start Date End Date Taking? Authorizing Provider  Black Cohosh 540 MG CAPS Take 1 capsule by mouth daily.    [provider]  cetirizine (ZYRTEC) 10 MG tablet Take 1 tablet by mouth daily.    [provider]  colestipol  (COLESTID ) 1 g tablet TAKE 1 TABLET (1 G TOTAL) BY MOUTH 3 (THREE) TIMES DAILY. 02/22/19   Luane Rumps, PA-C  Dulaglutide (TRULICITY) 0.75 MG/0.5ML SOAJ Inject 0.75 mg into the skin.    [provider]  glucose blood (ONETOUCH VERIO) test strip Use as directed to check blood glucose two times daily 08/31/19   Nida, Gebreselassie W, MD  Insulin Pen Needle (B-D ULTRAFINE III SHORT PEN) 31G X 8 MM MISC 1 each by Does not apply route as directed. 04/22/19   Nida, Gebreselassie W, MD  Lancets Thin MISC 1 each by Other route 4 (four) times daily. 04/26/19   Nida, Gebreselassie W, MD  Omega-3 Fatty Acids (FISH OIL) 1200 MG CAPS  Take 1 capsule by mouth daily.    [provider]  pantoprazole  (PROTONIX ) 40 MG tablet Take 1 tablet (40 mg total) by mouth 2 (two) times daily. 09/01/23 08/31/24  Vinetta Greening, DO    Family History Family History  Problem Relation Age of Onset   Colon cancer Neg Hx    Celiac disease Neg Hx    Inflammatory bowel disease Neg Hx     Social History Social History   Tobacco Use   Smoking status: Never   Smokeless tobacco: Never  Vaping Use   Vaping status: Never Used  Substance Use Topics   Alcohol use: No   Drug use: No     Allergies   Codeine, Demerol [meperidine], Morphine and codeine, Peanut  allergen powder-dnfp, and Statins   Review of Systems Review of Systems Per HPI  Physical Exam Triage Vital Signs ED Triage Vitals  Encounter Vitals Group     BP 10/05/23 1756 111/63     Systolic BP Percentile --      Diastolic BP Percentile --      Pulse Rate 10/05/23 1756 71     Resp 10/05/23 1756 18     Temp 10/05/23 1756 98.1 F (36.7 C)     Temp Source 10/05/23 1756 Oral     SpO2 10/05/23 1756 98 %     Weight --      Height --      Head Circumference --      Peak Flow --      Pain Score 10/05/23 1759 8     Pain Loc --      Pain Education --      Exclude from Growth Chart --    No data found.  Updated Vital Signs BP 111/63 (BP Location: Right Arm)   Pulse 71   Temp 98.1 F (36.7 C) (Oral)   Resp 18   LMP 08/31/2017   SpO2 98%   Visual Acuity Right Eye Distance:   Left Eye Distance:   Bilateral Distance:    Right Eye Near:   Left Eye Near:    Bilateral Near:     Physical Exam Vitals and nursing note reviewed.  Constitutional:      Appearance: Normal appearance.  Eyes:     Extraocular Movements: Extraocular movements intact.     Pupils: Pupils are equal, round, and reactive to light.  Pulmonary:     Effort: Pulmonary effort is normal.  Musculoskeletal:     Left foot: Decreased range of motion (d/t pain). Normal capillary refill. Swelling (4th and 5th toes) and tenderness (4th and 5th toes) present. No deformity. Normal pulse.  Skin:    General: Skin is warm and dry.  Neurological:     General: No focal deficit present.     Mental Status: She is alert and oriented to person, place, and time.  Psychiatric:        Mood and Affect: Mood normal.        Behavior: Behavior normal.      UC Treatments / Results  Labs (all labs ordered are listed, but only abnormal results are displayed) Labs Reviewed - No data to display  EKG   Radiology DG Foot Complete Left Result Date: 10/05/2023 CLINICAL DATA:  Pain in the fourth and fifth toes.  Injury.  EXAM: LEFT FOOT - COMPLETE 3+ VIEW COMPARISON:  None Available. FINDINGS: There is no evidence of fracture or dislocation. There is no evidence of arthropathy or other focal  bone abnormality. There is soft tissue swelling of the fourth and fifth toes. IMPRESSION: 1. No acute fracture or dislocation. 2. Soft tissue swelling of the fourth and fifth toes. Electronically Signed   By: Tyron Gallon M.D.   On: 10/05/2023 19:36    Procedures Procedures (including critical care time)  Medications Ordered in UC Medications - No data to display  Initial Impression / Assessment and Plan / UC Course  I have reviewed the triage vital signs and the nursing notes.  Pertinent labs & imaging results that were available during my care of the patient were reviewed by me and considered in my medical decision making (see chart for details).  X-ray of the left foot is pending.  In the interim, patient was provided postop shoe and 4th and 5th toes of the left foot were buddy taped.  Supportive care recommendations were provided and discussed with the patient to include RICE therapy, and over-the-counter analgesics.  Discussed indications with patient regarding follow-up.  Patient was advised she will be contacted if the results of the x-ray are abnormal.  Patient advised to follow-up with orthopedics if symptoms continue to persist over the next 4 to 5 weeks with a negative x-ray.  Patient was in agreement with this plan of care and verbalizes understanding.  All questions were answered.  Patient stable for discharge.  Update 1954: Received x-ray results of patient's right foot.  Call patient to discuss results.  Verified patient identification with 2 patient identifiers.  Patient was advised x-ray was negative for fracture or dislocation.  Patient advised to continue with current plan of care to include use of postop shoe, RICE therapy, and over-the-counter analgesics.  Discussed indications the patient regarding  follow-up.  Patient was in agreement with this plan of care and verbalized understanding.  All questions were answered. Final Clinical Impressions(s) / UC Diagnoses   Final diagnoses:  Pain and swelling of toe of left foot  Foot injury, left, initial encounter     Discharge Instructions      Go to [facility] emergency department for an x-ray of the left foot.  You will be contacted only if the results of the x-ray are abnormal. Wear the postop shoe when you are engaged in prolonged or strenuous activity. RICE therapy, rest, ice, compression, and elevation.  Apply ice for 20 minutes, remove for 1 hour, repeat is much as possible to help with pain or swelling. Wear shoes with good insole and support. Follow-up with orthopedics if your x-ray is negative and you continue to experience pain in the left foot/toes after 4 to 5 weeks. Follow-up as needed.     ED Prescriptions   None    PDMP not reviewed this encounter.   Hardy Lia, NP 10/05/23 1828    Hardy Lia, NP 10/05/23 1955

## 2023-10-05 NOTE — ED Triage Notes (Addendum)
 Pt reports hitting her two little toes on the left foot on a chair yesterday, now the toes are swollen and the whole foot has pain. Pt is aware we do not have on site imagining.

## 2023-10-13 ENCOUNTER — Ambulatory Visit: Admitting: Gastroenterology

## 2023-10-13 NOTE — Progress Notes (Deleted)
 Primary Care Physician:  Minus Amel, MD Primary GI:  ***   Patient Location: Home  Provider Location: [facility] office  Reason for Visit: ***  Persons present on the virtual encounter, with roles: Patient, myself (provider),***(updated meds and allergies)  Total time (minutes) spent on medical discussion: ***  Due to COVID-19, visit was conducted using *** method.  Visit was requested by patient.  Virtual Visit via ***  I connected with Nancyann Aye Fons on 10/13/23 at  4:00 PM EDT by *** and verified that I am speaking with the correct person using two identifiers.   I discussed the limitations, risks, security and privacy concerns of performing an evaluation and management service by telephone/video and the availability of in person appointments. I also discussed with the patient that there may be a patient responsible charge related to this service. The patient expressed understanding and agreed to proceed.   HPI:   Kendra Wilson is a 53 y.o. female who presents for virtual visit regarding ***  Labs  Pancreatic elastase >800. LFTs normal. TTG IgA <2, IgA normal. TSH 1.460. sed rate 8, CRP 3.   CT A/P with contrast 08/17/23:  IMPRESSION: -No acute findings. -Colonic diverticulosis, without radiographic evidence of diverticulitis. -Small hiatal hernia.   PCP completed stool test 08/2023: Cdiff and stool culture negative.  EGD 09/2023: -2cm hiatal hernia -moderate schatzki ring s/p dilaiton -gastritis, minimal chronic gastritis with reactive epithelial changes and lympooid aggregates. Neg for h.pylori   Overdue for colonoscopy.  She tried to get 1 done several years ago but did not tolerate trial lightly.  Vomited with the first half and could not keep any of it down.   Colonoscopy 2011: -5mm rectal polyp removed, hyperplastic -diverticulosis -internal hemorrhoids  Current Outpatient Medications  Medication Sig Dispense Refill   Black Cohosh 540 MG  CAPS Take 1 capsule by mouth daily.     cetirizine (ZYRTEC) 10 MG tablet Take 1 tablet by mouth daily.     colestipol  (COLESTID ) 1 g tablet TAKE 1 TABLET (1 G TOTAL) BY MOUTH 3 (THREE) TIMES DAILY. 90 tablet 2   Dulaglutide (TRULICITY) 0.75 MG/0.5ML SOAJ Inject 0.75 mg into the skin.     glucose blood (ONETOUCH VERIO) test strip Use as directed to check blood glucose two times daily 200 strip 2   Insulin Pen Needle (B-D ULTRAFINE III SHORT PEN) 31G X 8 MM MISC 1 each by Does not apply route as directed. 100 each 3   Lancets Thin MISC 1 each by Other route 4 (four) times daily. 150 each 5   Omega-3 Fatty Acids (FISH OIL) 1200 MG CAPS Take 1 capsule by mouth daily.     pantoprazole  (PROTONIX ) 40 MG tablet Take 1 tablet (40 mg total) by mouth 2 (two) times daily. 60 tablet 11   No current facility-administered medications for this visit.    Past Medical History:  Diagnosis Date   Diabetes mellitus without complication (HCC)    Diabetes mellitus, type II (HCC)    History of kidney stones    Hyperlipidemia    PONV (postoperative nausea and vomiting)     Past Surgical History:  Procedure Laterality Date   CARPAL TUNNEL RELEASE Left    CATARACT EXTRACTION W/PHACO Left 09/23/2021   Procedure: CATARACT EXTRACTION PHACO AND INTRAOCULAR LENS PLACEMENT (IOC);  Surgeon: Tarri Farm, MD;  Location: [facility];  Service: Ophthalmology;  Laterality: Left;  CDE: 8.95   CHOLECYSTECTOMY     ESOPHAGEAL DILATION  N/A 09/01/2023   Procedure: DILATION, ESOPHAGUS;  Surgeon: Vinetta Greening, DO;  Location: [facility];  Service: Endoscopy;  Laterality: N/A;   ESOPHAGOGASTRODUODENOSCOPY N/A 09/01/2023   Procedure: EGD (ESOPHAGOGASTRODUODENOSCOPY);  Surgeon: Vinetta Greening, DO;  Location: [facility];  Service: Endoscopy;  Laterality: N/A;  10:15 am, asa 2   LITHOTRIPSY  2018   TUBAL LIGATION     WISDOM TOOTH EXTRACTION      Family History  Problem Relation Age of Onset   Colon cancer Neg Hx    Celiac  disease Neg Hx    Inflammatory bowel disease Neg Hx     Social History   Socioeconomic History   Marital status: Single    Spouse name: Not on file   Number of children: Not on file   Years of education: Not on file   Highest education level: Not on file  Occupational History   Not on file  Tobacco Use   Smoking status: Never   Smokeless tobacco: Never  Vaping Use   Vaping status: Never Used  Substance and Sexual Activity   Alcohol use: No   Drug use: No   Sexual activity: Yes    Birth control/protection: Surgical  Other Topics Concern   Not on file  Social History Narrative   Not on file   Social Drivers of Health   Financial Resource Strain: Not on file  Food Insecurity: Not on file  Transportation Needs: Not on file  Physical Activity: Not on file  Stress: Not on file  Social Connections: Not on file  Intimate Partner Violence: Not on file      ROS:  General: Negative for anorexia, weight loss, fever, chills, fatigue, weakness. Eyes: Negative for vision changes.  ENT: Negative for hoarseness, difficulty swallowing , nasal congestion. CV: Negative for chest pain, angina, palpitations, dyspnea on exertion, peripheral edema.  Respiratory: Negative for dyspnea at rest, dyspnea on exertion, cough, sputum, wheezing.  GI: See history of present illness. GU:  Negative for dysuria, hematuria, urinary incontinence, urinary frequency, nocturnal urination.  MS: Negative for joint pain, low back pain.  Derm: Negative for rash or itching.  Neuro: Negative for weakness, abnormal sensation, seizure, frequent headaches, memory loss, confusion.  Psych: Negative for anxiety, depression, suicidal ideation, hallucinations.  Endo: Negative for unusual weight change.  Heme: Negative for bruising or bleeding. Allergy: Negative for rash or hives.   Observations/Objective:   Assessment and Plan: *** RUQ/epigastric pain/nausea/dysphagia:may be secondary to delay gastric  emptying, gastritis, PUD, less likely biliary or malignancy. Dysphagia may be due to peptic stricture but EOE remains in differential. -EGD/ED with Dr. Mordechai April. ASA 2.  I have discussed the risks, alternatives, benefits with regards to but not limited to the risk of reaction to medication, bleeding, infection, perforation and the patient is agreeable to proceed. Written consent to be obtained. -if EGD not in the next couple of weeks, encouraged her to start PPI as already prescribed   Elevated Tbili: likely insignificant -recheck LFTs    Chronic diarrhea: differential includes IBS, bile salt diarrhea, celiac disease, thyroid dysfunction but given reported fatty stools, we need to also rule out exocrine pancreatic insufficiency -fecal elastaste -TTG IgA, serum IgA -TSH, free T4 -CRP, sed rate   Colonoscopy: overdue for screening, may need to help determine source of chronic diarrhea (pending other work up). Will work on setting up after current UGI symptoms addressed. She will need pill prep or small volume prep (samples if necessary).  Follow Up Instructions:    I discussed the assessment and treatment plan with the patient. The patient was provided an opportunity to ask questions and all were answered. The patient agreed with the plan and demonstrated an understanding of the instructions. AVS mailed to patient's home address.   The patient was advised to call back or seek an in-person evaluation if the symptoms worsen or if the condition fails to improve as anticipated.  I provided *** minutes of virtual face-to-face time during this encounter.   Azalee Lenz, PA-C

## 2023-10-13 NOTE — Progress Notes (Deleted)
 GI Office Note    Referring Provider: Minus Amel, MD Primary Care Physician:  Minus Amel, MD  Primary Gastroenterologist: Rolando Cliche. Mordechai April, DO   Chief Complaint   No chief complaint on file.   History of Present Illness   Kendra Wilson is a 53 y.o. female presenting today    Labs  CT A/P with contrast 08/17/23:  IMPRESSION: -No acute findings. -Colonic diverticulosis, without radiographic evidence of diverticulitis. -Small hiatal hernia.   PCP completed stool test 08/2023: Cdiff and stool culture negative.  EGD 09/2023: -2cm hiatal hernia -moderate schatzki ring s/p dilaiton -gastritis, minimal chronic gastritis with reactive epithelial changes and lympooid aggregates. Neg for h.pylori   Overdue for colonoscopy.  She tried to get 1 done several years ago but did not tolerate trial lightly.  Vomited with the first half and could not keep any of it down.   Colonoscopy 2011: -5mm rectal polyp removed, hyperplastic -diverticulosis -internal hemorrhoids  Medications   Current Outpatient Medications  Medication Sig Dispense Refill   Black Cohosh 540 MG CAPS Take 1 capsule by mouth daily.     cetirizine (ZYRTEC) 10 MG tablet Take 1 tablet by mouth daily.     colestipol  (COLESTID ) 1 g tablet TAKE 1 TABLET (1 G TOTAL) BY MOUTH 3 (THREE) TIMES DAILY. 90 tablet 2   Dulaglutide (TRULICITY) 0.75 MG/0.5ML SOAJ Inject 0.75 mg into the skin.     glucose blood (ONETOUCH VERIO) test strip Use as directed to check blood glucose two times daily 200 strip 2   Insulin Pen Needle (B-D ULTRAFINE III SHORT PEN) 31G X 8 MM MISC 1 each by Does not apply route as directed. 100 each 3   Lancets Thin MISC 1 each by Other route 4 (four) times daily. 150 each 5   Omega-3 Fatty Acids (FISH OIL) 1200 MG CAPS Take 1 capsule by mouth daily.     pantoprazole  (PROTONIX ) 40 MG tablet Take 1 tablet (40 mg total) by mouth 2 (two) times daily. 60 tablet 11   No current  facility-administered medications for this visit.    Allergies   Allergies as of 10/13/2023 - Review Complete 10/05/2023  Allergen Reaction Noted   Codeine Nausea And Vomiting 07/03/2017   Demerol [meperidine]  07/03/2017   Morphine and codeine Nausea And Vomiting 07/03/2017   Peanut allergen powder-dnfp  08/26/2023   Statins Swelling 01/28/2018     Past Medical History   Past Medical History:  Diagnosis Date   Diabetes mellitus without complication (HCC)    Diabetes mellitus, type II (HCC)    History of kidney stones    Hyperlipidemia    PONV (postoperative nausea and vomiting)     Past Surgical History   Past Surgical History:  Procedure Laterality Date   CARPAL TUNNEL RELEASE Left    CATARACT EXTRACTION W/PHACO Left 09/23/2021   Procedure: CATARACT EXTRACTION PHACO AND INTRAOCULAR LENS PLACEMENT (IOC);  Surgeon: Tarri Farm, MD;  Location: [facility];  Service: Ophthalmology;  Laterality: Left;  CDE: 8.95   CHOLECYSTECTOMY     ESOPHAGEAL DILATION N/A 09/01/2023   Procedure: DILATION, ESOPHAGUS;  Surgeon: Vinetta Greening, DO;  Location: [facility];  Service: Endoscopy;  Laterality: N/A;   ESOPHAGOGASTRODUODENOSCOPY N/A 09/01/2023   Procedure: EGD (ESOPHAGOGASTRODUODENOSCOPY);  Surgeon: Vinetta Greening, DO;  Location: [facility];  Service: Endoscopy;  Laterality: N/A;  10:15 am, asa 2   LITHOTRIPSY  2018   TUBAL LIGATION     WISDOM TOOTH  EXTRACTION      Past Family History   Family History  Problem Relation Age of Onset   Colon cancer Neg Hx    Celiac disease Neg Hx    Inflammatory bowel disease Neg Hx     Past Social History   Social History   Socioeconomic History   Marital status: Single    Spouse name: Not on file   Number of children: Not on file   Years of education: Not on file   Highest education level: Not on file  Occupational History   Not on file  Tobacco Use   Smoking status: Never   Smokeless tobacco: Never  Vaping Use   Vaping  status: Never Used  Substance and Sexual Activity   Alcohol use: No   Drug use: No   Sexual activity: Yes    Birth control/protection: Surgical  Other Topics Concern   Not on file  Social History Narrative   Not on file   Social Drivers of Health   Financial Resource Strain: Not on file  Food Insecurity: Not on file  Transportation Needs: Not on file  Physical Activity: Not on file  Stress: Not on file  Social Connections: Not on file  Intimate Partner Violence: Not on file    Review of Systems   General: Negative for anorexia, weight loss, fever, chills, fatigue, weakness. ENT: Negative for hoarseness, difficulty swallowing , nasal congestion. CV: Negative for chest pain, angina, palpitations, dyspnea on exertion, peripheral edema.  Respiratory: Negative for dyspnea at rest, dyspnea on exertion, cough, sputum, wheezing.  GI: See history of present illness. GU:  Negative for dysuria, hematuria, urinary incontinence, urinary frequency, nocturnal urination.  Endo: Negative for unusual weight change.     Physical Exam   LMP 08/31/2017    General: Well-nourished, well-developed in no acute distress.  Eyes: No icterus. Mouth: Oropharyngeal mucosa moist and pink , no lesions erythema or exudate. Lungs: Clear to auscultation bilaterally.  Heart: Regular rate and rhythm, no murmurs rubs or gallops.  Abdomen: Bowel sounds are normal, nontender, nondistended, no hepatosplenomegaly or masses,  no abdominal bruits or hernia , no rebound or guarding.  Rectal: ***  Extremities: No lower extremity edema. No clubbing or deformities. Neuro: Alert and oriented x 4   Skin: Warm and dry, no jaundice.   Psych: Alert and cooperative, normal mood and affect.  Labs   *** Imaging Studies   DG Foot Complete Left Result Date: 10/05/2023 CLINICAL DATA:  Pain in the fourth and fifth toes.  Injury. EXAM: LEFT FOOT - COMPLETE 3+ VIEW COMPARISON:  None Available. FINDINGS: There is no evidence  of fracture or dislocation. There is no evidence of arthropathy or other focal bone abnormality. There is soft tissue swelling of the fourth and fifth toes. IMPRESSION: 1. No acute fracture or dislocation. 2. Soft tissue swelling of the fourth and fifth toes. Electronically Signed   By: Tyron Gallon M.D.   On: 10/05/2023 19:36    Assessment       PLAN   *** RUQ/epigastric pain/nausea/dysphagia:may be secondary to delay gastric emptying, gastritis, PUD, less likely biliary or malignancy. Dysphagia may be due to peptic stricture but EOE remains in differential. -EGD/ED with Dr. Mordechai April. ASA 2.  I have discussed the risks, alternatives, benefits with regards to but not limited to the risk of reaction to medication, bleeding, infection, perforation and the patient is agreeable to proceed. Written consent to be obtained. -if EGD not in the next couple of  weeks, encouraged her to start PPI as already prescribed   Elevated Tbili: likely insignificant -recheck LFTs    Chronic diarrhea: differential includes IBS, bile salt diarrhea, celiac disease, thyroid dysfunction but given reported fatty stools, we need to also rule out exocrine pancreatic insufficiency -fecal elastaste -TTG IgA, serum IgA -TSH, free T4 -CRP, sed rate   Colonoscopy: overdue for screening, may need to help determine source of chronic diarrhea (pending other work up). Will work on setting up after current UGI symptoms addressed. She will need pill prep or small volume prep (samples if necessary).      Trudie Fuse. Harles Lied, MHS, PA-C [facility]

## 2023-11-01 NOTE — H&P (View-Only) (Signed)
 GI Office Note    Referring Provider: Minus Amel, MD Primary Care Physician:  Minus Amel, MD  Primary Gastroenterologist:  Chief Complaint   No chief complaint on file.   History of Present Illness   Kendra Wilson is a 53 y.o. female presenting today      Labs   Pancreatic elastase >800. LFTs normal. TTG IgA <2, IgA normal. TSH 1.460. sed rate 8, CRP 3.   CT A/P with contrast 08/17/23:  IMPRESSION: -No acute findings. -Colonic diverticulosis, without radiographic evidence of diverticulitis. -Small hiatal hernia.   PCP completed stool test 08/2023: Cdiff and stool culture negative.   EGD 09/2023: -2cm hiatal hernia -moderate schatzki ring s/p dilaiton -gastritis, minimal chronic gastritis with reactive epithelial changes and lympooid aggregates. Neg for h.pylori    Overdue for colonoscopy.  She tried to get 1 done several years ago but did not tolerate trial lightly.  Vomited with the first half and could not keep any of it down.   Colonoscopy 2011: -5mm rectal polyp removed, hyperplastic -diverticulosis -internal hemorrhoids     Medications   Current Outpatient Medications  Medication Sig Dispense Refill   Black Cohosh 540 MG CAPS Take 1 capsule by mouth daily.     cetirizine (ZYRTEC) 10 MG tablet Take 1 tablet by mouth daily.     colestipol  (COLESTID ) 1 g tablet TAKE 1 TABLET (1 G TOTAL) BY MOUTH 3 (THREE) TIMES DAILY. 90 tablet 2   Dulaglutide (TRULICITY) 0.75 MG/0.5ML SOAJ Inject 0.75 mg into the skin.     glucose blood (ONETOUCH VERIO) test strip Use as directed to check blood glucose two times daily 200 strip 2   Insulin Pen Needle (B-D ULTRAFINE III SHORT PEN) 31G X 8 MM MISC 1 each by Does not apply route as directed. 100 each 3   Lancets Thin MISC 1 each by Other route 4 (four) times daily. 150 each 5   Omega-3 Fatty Acids (FISH OIL) 1200 MG CAPS Take 1 capsule by mouth daily.     pantoprazole  (PROTONIX ) 40 MG tablet Take 1 tablet (40  mg total) by mouth 2 (two) times daily. 60 tablet 11   No current facility-administered medications for this visit.    Allergies   Allergies as of 11/02/2023 - Review Complete 10/05/2023  Allergen Reaction Noted   Codeine Nausea And Vomiting 07/03/2017   Demerol [meperidine]  07/03/2017   Morphine and codeine Nausea And Vomiting 07/03/2017   Peanut allergen powder-dnfp  08/26/2023   Statins Swelling 01/28/2018     Past Medical History   Past Medical History:  Diagnosis Date   Diabetes mellitus without complication (HCC)    Diabetes mellitus, type II (HCC)    History of kidney stones    Hyperlipidemia    PONV (postoperative nausea and vomiting)     Past Surgical History   Past Surgical History:  Procedure Laterality Date   CARPAL TUNNEL RELEASE Left    CATARACT EXTRACTION W/PHACO Left 09/23/2021   Procedure: CATARACT EXTRACTION PHACO AND INTRAOCULAR LENS PLACEMENT (IOC);  Surgeon: Tarri Farm, MD;  Location: [facility];  Service: Ophthalmology;  Laterality: Left;  CDE: 8.95   CHOLECYSTECTOMY     ESOPHAGEAL DILATION N/A 09/01/2023   Procedure: DILATION, ESOPHAGUS;  Surgeon: Vinetta Greening, DO;  Location: [facility];  Service: Endoscopy;  Laterality: N/A;   ESOPHAGOGASTRODUODENOSCOPY N/A 09/01/2023   Procedure: EGD (ESOPHAGOGASTRODUODENOSCOPY);  Surgeon: Vinetta Greening, DO;  Location: [facility];  Service: Endoscopy;  Laterality: N/A;  10:15 am, asa 2   LITHOTRIPSY  2018   TUBAL LIGATION     WISDOM TOOTH EXTRACTION      Past Family History   Family History  Problem Relation Age of Onset   Colon cancer Neg Hx    Celiac disease Neg Hx    Inflammatory bowel disease Neg Hx     Past Social History   Social History   Socioeconomic History   Marital status: Single    Spouse name: Not on file   Number of children: Not on file   Years of education: Not on file   Highest education level: Not on file  Occupational History   Not on file  Tobacco Use   Smoking  status: Never   Smokeless tobacco: Never  Vaping Use   Vaping status: Never Used  Substance and Sexual Activity   Alcohol use: No   Drug use: No   Sexual activity: Yes    Birth control/protection: Surgical  Other Topics Concern   Not on file  Social History Narrative   Not on file   Social Drivers of Health   Financial Resource Strain: Not on file  Food Insecurity: Not on file  Transportation Needs: Not on file  Physical Activity: Not on file  Stress: Not on file  Social Connections: Not on file  Intimate Partner Violence: Not on file    Review of Systems   General: Negative for anorexia, weight loss, fever, chills, fatigue, weakness. ENT: Negative for hoarseness, difficulty swallowing , nasal congestion. CV: Negative for chest pain, angina, palpitations, dyspnea on exertion, peripheral edema.  Respiratory: Negative for dyspnea at rest, dyspnea on exertion, cough, sputum, wheezing.  GI: See history of present illness. GU:  Negative for dysuria, hematuria, urinary incontinence, urinary frequency, nocturnal urination.  Endo: Negative for unusual weight change.     Physical Exam   LMP 08/31/2017    General: Well-nourished, well-developed in no acute distress.  Eyes: No icterus. Mouth: Oropharyngeal mucosa moist and pink , no lesions erythema or exudate. Lungs: Clear to auscultation bilaterally.  Heart: Regular rate and rhythm, no murmurs rubs or gallops.  Abdomen: Bowel sounds are normal, nontender, nondistended, no hepatosplenomegaly or masses,  no abdominal bruits or hernia , no rebound or guarding.  Rectal: ***  Extremities: No lower extremity edema. No clubbing or deformities. Neuro: Alert and oriented x 4   Skin: Warm and dry, no jaundice.   Psych: Alert and cooperative, normal mood and affect.  Labs   *** Imaging Studies   DG Foot Complete Left Result Date: 10/05/2023 CLINICAL DATA:  Pain in the fourth and fifth toes.  Injury. EXAM: LEFT FOOT - COMPLETE 3+  VIEW COMPARISON:  None Available. FINDINGS: There is no evidence of fracture or dislocation. There is no evidence of arthropathy or other focal bone abnormality. There is soft tissue swelling of the fourth and fifth toes. IMPRESSION: 1. No acute fracture or dislocation. 2. Soft tissue swelling of the fourth and fifth toes. Electronically Signed   By: Tyron Gallon M.D.   On: 10/05/2023 19:36    Assessment       PLAN   ***  RUQ/epigastric pain/nausea/dysphagia:may be secondary to delay gastric emptying, gastritis, PUD, less likely biliary or malignancy. Dysphagia may be due to peptic stricture but EOE remains in differential. -EGD/ED with Dr. Mordechai April. ASA 2.  I have discussed the risks, alternatives, benefits with regards to but not limited to the risk of reaction to medication,  bleeding, infection, perforation and the patient is agreeable to proceed. Written consent to be obtained. -if EGD not in the next couple of weeks, encouraged her to start PPI as already prescribed   Elevated Tbili: likely insignificant -recheck LFTs    Chronic diarrhea: differential includes IBS, bile salt diarrhea, celiac disease, thyroid dysfunction but given reported fatty stools, we need to also rule out exocrine pancreatic insufficiency -fecal elastaste -TTG IgA, serum IgA -TSH, free T4 -CRP, sed rate   Colonoscopy: overdue for screening, may need to help determine source of chronic diarrhea (pending other work up). Will work on setting up after current UGI symptoms addressed. She will need pill prep or small volume prep (samples if necessary).     Trudie Fuse. Harles Lied, MHS, PA-C [facility]

## 2023-11-01 NOTE — Progress Notes (Unsigned)
 GI Office Note    Referring Provider: Minus Amel, MD Primary Care Physician:  Minus Amel, MD  Primary Gastroenterologist:  Chief Complaint   No chief complaint on file.   History of Present Illness   Kendra Wilson is a 53 y.o. female presenting today      Labs   Pancreatic elastase >800. LFTs normal. TTG IgA <2, IgA normal. TSH 1.460. sed rate 8, CRP 3.   CT A/P with contrast 08/17/23:  IMPRESSION: -No acute findings. -Colonic diverticulosis, without radiographic evidence of diverticulitis. -Small hiatal hernia.   PCP completed stool test 08/2023: Cdiff and stool culture negative.   EGD 09/2023: -2cm hiatal hernia -moderate schatzki ring s/p dilaiton -gastritis, minimal chronic gastritis with reactive epithelial changes and lympooid aggregates. Neg for h.pylori    Overdue for colonoscopy.  She tried to get 1 done several years ago but did not tolerate trial lightly.  Vomited with the first half and could not keep any of it down.   Colonoscopy 2011: -5mm rectal polyp removed, hyperplastic -diverticulosis -internal hemorrhoids     Medications   Current Outpatient Medications  Medication Sig Dispense Refill   Black Cohosh 540 MG CAPS Take 1 capsule by mouth daily.     cetirizine (ZYRTEC) 10 MG tablet Take 1 tablet by mouth daily.     colestipol  (COLESTID ) 1 g tablet TAKE 1 TABLET (1 G TOTAL) BY MOUTH 3 (THREE) TIMES DAILY. 90 tablet 2   Dulaglutide (TRULICITY) 0.75 MG/0.5ML SOAJ Inject 0.75 mg into the skin.     glucose blood (ONETOUCH VERIO) test strip Use as directed to check blood glucose two times daily 200 strip 2   Insulin Pen Needle (B-D ULTRAFINE III SHORT PEN) 31G X 8 MM MISC 1 each by Does not apply route as directed. 100 each 3   Lancets Thin MISC 1 each by Other route 4 (four) times daily. 150 each 5   Omega-3 Fatty Acids (FISH OIL) 1200 MG CAPS Take 1 capsule by mouth daily.     pantoprazole  (PROTONIX ) 40 MG tablet Take 1 tablet (40  mg total) by mouth 2 (two) times daily. 60 tablet 11   No current facility-administered medications for this visit.    Allergies   Allergies as of 11/02/2023 - Review Complete 10/05/2023  Allergen Reaction Noted   Codeine Nausea And Vomiting 07/03/2017   Demerol [meperidine]  07/03/2017   Morphine and codeine Nausea And Vomiting 07/03/2017   Peanut allergen powder-dnfp  08/26/2023   Statins Swelling 01/28/2018     Past Medical History   Past Medical History:  Diagnosis Date   Diabetes mellitus without complication (HCC)    Diabetes mellitus, type II (HCC)    History of kidney stones    Hyperlipidemia    PONV (postoperative nausea and vomiting)     Past Surgical History   Past Surgical History:  Procedure Laterality Date   CARPAL TUNNEL RELEASE Left    CATARACT EXTRACTION W/PHACO Left 09/23/2021   Procedure: CATARACT EXTRACTION PHACO AND INTRAOCULAR LENS PLACEMENT (IOC);  Surgeon: Tarri Farm, MD;  Location: [facility];  Service: Ophthalmology;  Laterality: Left;  CDE: 8.95   CHOLECYSTECTOMY     ESOPHAGEAL DILATION N/A 09/01/2023   Procedure: DILATION, ESOPHAGUS;  Surgeon: Vinetta Greening, DO;  Location: [facility];  Service: Endoscopy;  Laterality: N/A;   ESOPHAGOGASTRODUODENOSCOPY N/A 09/01/2023   Procedure: EGD (ESOPHAGOGASTRODUODENOSCOPY);  Surgeon: Vinetta Greening, DO;  Location: [facility];  Service: Endoscopy;  Laterality: N/A;  10:15 am, asa 2   LITHOTRIPSY  2018   TUBAL LIGATION     WISDOM TOOTH EXTRACTION      Past Family History   Family History  Problem Relation Age of Onset   Colon cancer Neg Hx    Celiac disease Neg Hx    Inflammatory bowel disease Neg Hx     Past Social History   Social History   Socioeconomic History   Marital status: Single    Spouse name: Not on file   Number of children: Not on file   Years of education: Not on file   Highest education level: Not on file  Occupational History   Not on file  Tobacco Use   Smoking  status: Never   Smokeless tobacco: Never  Vaping Use   Vaping status: Never Used  Substance and Sexual Activity   Alcohol use: No   Drug use: No   Sexual activity: Yes    Birth control/protection: Surgical  Other Topics Concern   Not on file  Social History Narrative   Not on file   Social Drivers of Health   Financial Resource Strain: Not on file  Food Insecurity: Not on file  Transportation Needs: Not on file  Physical Activity: Not on file  Stress: Not on file  Social Connections: Not on file  Intimate Partner Violence: Not on file    Review of Systems   General: Negative for anorexia, weight loss, fever, chills, fatigue, weakness. ENT: Negative for hoarseness, difficulty swallowing , nasal congestion. CV: Negative for chest pain, angina, palpitations, dyspnea on exertion, peripheral edema.  Respiratory: Negative for dyspnea at rest, dyspnea on exertion, cough, sputum, wheezing.  GI: See history of present illness. GU:  Negative for dysuria, hematuria, urinary incontinence, urinary frequency, nocturnal urination.  Endo: Negative for unusual weight change.     Physical Exam   LMP 08/31/2017    General: Well-nourished, well-developed in no acute distress.  Eyes: No icterus. Mouth: Oropharyngeal mucosa moist and pink , no lesions erythema or exudate. Lungs: Clear to auscultation bilaterally.  Heart: Regular rate and rhythm, no murmurs rubs or gallops.  Abdomen: Bowel sounds are normal, nontender, nondistended, no hepatosplenomegaly or masses,  no abdominal bruits or hernia , no rebound or guarding.  Rectal: ***  Extremities: No lower extremity edema. No clubbing or deformities. Neuro: Alert and oriented x 4   Skin: Warm and dry, no jaundice.   Psych: Alert and cooperative, normal mood and affect.  Labs   *** Imaging Studies   DG Foot Complete Left Result Date: 10/05/2023 CLINICAL DATA:  Pain in the fourth and fifth toes.  Injury. EXAM: LEFT FOOT - COMPLETE 3+  VIEW COMPARISON:  None Available. FINDINGS: There is no evidence of fracture or dislocation. There is no evidence of arthropathy or other focal bone abnormality. There is soft tissue swelling of the fourth and fifth toes. IMPRESSION: 1. No acute fracture or dislocation. 2. Soft tissue swelling of the fourth and fifth toes. Electronically Signed   By: Tyron Gallon M.D.   On: 10/05/2023 19:36    Assessment       PLAN   ***  RUQ/epigastric pain/nausea/dysphagia:may be secondary to delay gastric emptying, gastritis, PUD, less likely biliary or malignancy. Dysphagia may be due to peptic stricture but EOE remains in differential. -EGD/ED with Dr. Mordechai April. ASA 2.  I have discussed the risks, alternatives, benefits with regards to but not limited to the risk of reaction to medication,  bleeding, infection, perforation and the patient is agreeable to proceed. Written consent to be obtained. -if EGD not in the next couple of weeks, encouraged her to start PPI as already prescribed   Elevated Tbili: likely insignificant -recheck LFTs    Chronic diarrhea: differential includes IBS, bile salt diarrhea, celiac disease, thyroid dysfunction but given reported fatty stools, we need to also rule out exocrine pancreatic insufficiency -fecal elastaste -TTG IgA, serum IgA -TSH, free T4 -CRP, sed rate   Colonoscopy: overdue for screening, may need to help determine source of chronic diarrhea (pending other work up). Will work on setting up after current UGI symptoms addressed. She will need pill prep or small volume prep (samples if necessary).     Trudie Fuse. Harles Lied, MHS, PA-C [facility]

## 2023-11-02 ENCOUNTER — Encounter: Payer: Self-pay | Admitting: Gastroenterology

## 2023-11-02 ENCOUNTER — Ambulatory Visit: Admitting: Gastroenterology

## 2023-11-02 VITALS — BP 101/69 | HR 81 | Temp 98.0°F | Ht 67.0 in | Wt 186.2 lb

## 2023-11-02 DIAGNOSIS — K449 Diaphragmatic hernia without obstruction or gangrene: Secondary | ICD-10-CM

## 2023-11-02 DIAGNOSIS — R1011 Right upper quadrant pain: Secondary | ICD-10-CM

## 2023-11-02 DIAGNOSIS — R110 Nausea: Secondary | ICD-10-CM | POA: Diagnosis not present

## 2023-11-02 DIAGNOSIS — R1310 Dysphagia, unspecified: Secondary | ICD-10-CM

## 2023-11-02 DIAGNOSIS — R1013 Epigastric pain: Secondary | ICD-10-CM

## 2023-11-02 DIAGNOSIS — K2990 Gastroduodenitis, unspecified, without bleeding: Secondary | ICD-10-CM

## 2023-11-02 DIAGNOSIS — R748 Abnormal levels of other serum enzymes: Secondary | ICD-10-CM

## 2023-11-02 DIAGNOSIS — Z1211 Encounter for screening for malignant neoplasm of colon: Secondary | ICD-10-CM

## 2023-11-02 DIAGNOSIS — K529 Noninfective gastroenteritis and colitis, unspecified: Secondary | ICD-10-CM

## 2023-11-02 MED ORDER — RABEPRAZOLE SODIUM 20 MG PO TBEC: 20.0000 mg | DELAYED_RELEASE_TABLET | Freq: Two times a day (BID) | ORAL | 3 refills | Status: DC

## 2023-11-02 NOTE — Patient Instructions (Signed)
 Stop pantoprazole . Start rabeprazole 20mg  twice daily before breakfast and supper.   You can try taking colestid  two tablets in the morning.   We will move forward with colonoscopy in the near future.

## 2023-11-03 ENCOUNTER — Telehealth: Payer: Self-pay | Admitting: Gastroenterology

## 2023-11-03 ENCOUNTER — Encounter: Payer: Self-pay | Admitting: *Deleted

## 2023-11-03 DIAGNOSIS — R748 Abnormal levels of other serum enzymes: Secondary | ICD-10-CM | POA: Insufficient documentation

## 2023-11-03 DIAGNOSIS — Z1211 Encounter for screening for malignant neoplasm of colon: Secondary | ICD-10-CM | POA: Insufficient documentation

## 2023-11-03 DIAGNOSIS — K2990 Gastroduodenitis, unspecified, without bleeding: Secondary | ICD-10-CM | POA: Insufficient documentation

## 2023-11-03 NOTE — Telephone Encounter (Signed)
 Please schedule colonoscopy with Dr. Mordechai April. ASA 2.  Dx: chronic diarrhea, colon cancer screening  Hold trulicity for 7 days. Hold colestid  for 4 days.   Patient needs pill prep or short volume prep. May need samples.

## 2023-11-03 NOTE — Telephone Encounter (Signed)
 Called pt, went straight to VM. Also sent mychart message

## 2023-11-04 ENCOUNTER — Encounter: Payer: Self-pay | Admitting: *Deleted

## 2023-11-19 NOTE — Telephone Encounter (Signed)
 Reviewed attachments from allergy testing.

## 2023-11-24 ENCOUNTER — Encounter (HOSPITAL_COMMUNITY): Payer: Self-pay | Admitting: Internal Medicine

## 2023-11-24 ENCOUNTER — Ambulatory Visit (HOSPITAL_COMMUNITY): Payer: Self-pay | Admitting: Anesthesiology

## 2023-11-24 ENCOUNTER — Ambulatory Visit (HOSPITAL_COMMUNITY)
Admission: RE | Admit: 2023-11-24 | Discharge: 2023-11-24 | Disposition: A | Discharge status: Home / Self Care | Attending: Internal Medicine | Admitting: Internal Medicine

## 2023-11-24 ENCOUNTER — Other Ambulatory Visit: Payer: Self-pay

## 2023-11-24 ENCOUNTER — Encounter (HOSPITAL_COMMUNITY)
Admission: RE | Disposition: A | Discharge status: Home / Self Care | Payer: Self-pay | Source: Home / Self Care | Attending: Internal Medicine

## 2023-11-24 DIAGNOSIS — Z1211 Encounter for screening for malignant neoplasm of colon: Secondary | ICD-10-CM | POA: Diagnosis present

## 2023-11-24 DIAGNOSIS — E119 Type 2 diabetes mellitus without complications: Secondary | ICD-10-CM | POA: Insufficient documentation

## 2023-11-24 DIAGNOSIS — K529 Noninfective gastroenteritis and colitis, unspecified: Secondary | ICD-10-CM | POA: Insufficient documentation

## 2023-11-24 DIAGNOSIS — Z79899 Other long term (current) drug therapy: Secondary | ICD-10-CM | POA: Insufficient documentation

## 2023-11-24 DIAGNOSIS — K648 Other hemorrhoids: Secondary | ICD-10-CM

## 2023-11-24 DIAGNOSIS — K5730 Diverticulosis of large intestine without perforation or abscess without bleeding: Secondary | ICD-10-CM | POA: Insufficient documentation

## 2023-11-24 DIAGNOSIS — Z7985 Long-term (current) use of injectable non-insulin antidiabetic drugs: Secondary | ICD-10-CM | POA: Insufficient documentation

## 2023-11-24 DIAGNOSIS — K644 Residual hemorrhoidal skin tags: Secondary | ICD-10-CM | POA: Diagnosis not present

## 2023-11-24 HISTORY — PX: COLONOSCOPY: SHX5424

## 2023-11-24 LAB — GLUCOSE, CAPILLARY: Glucose-Capillary: 99 mg/dL (ref 70–99)

## 2023-11-24 SURGERY — COLONOSCOPY
Anesthesia: General

## 2023-11-24 MED ORDER — LACTATED RINGERS IV SOLN: INTRAVENOUS | Status: DC

## 2023-11-24 MED ORDER — LIDOCAINE 2% (20 MG/ML) 5 ML SYRINGE
INTRAMUSCULAR | Status: DC | PRN
Start: 2023-11-24 — End: 2023-11-24
  Administered 2023-11-24: 100 mg via INTRAVENOUS

## 2023-11-24 MED ORDER — PROPOFOL 10 MG/ML IV BOLUS
INTRAVENOUS | Status: DC | PRN
Start: 2023-11-24 — End: 2023-11-24
  Administered 2023-11-24: 100 mg via INTRAVENOUS

## 2023-11-24 MED ORDER — PHENYLEPHRINE 80 MCG/ML (10ML) SYRINGE FOR IV PUSH (FOR BLOOD PRESSURE SUPPORT)
PREFILLED_SYRINGE | INTRAVENOUS | Status: DC | PRN
  Administered 2023-11-24: 80 ug via INTRAVENOUS

## 2023-11-24 MED ORDER — LIDOCAINE 2% (20 MG/ML) 5 ML SYRINGE
INTRAMUSCULAR | Status: AC
  Filled 2023-11-24: qty 5

## 2023-11-24 MED ORDER — PROPOFOL 500 MG/50ML IV EMUL
INTRAVENOUS | Status: DC | PRN
  Administered 2023-11-24: 150 ug/kg/min via INTRAVENOUS

## 2023-11-24 NOTE — Anesthesia Preprocedure Evaluation (Signed)
 Anesthesia Evaluation  Patient identified by MRN, date of birth, ID band Patient awake    Reviewed: Allergy & Precautions, H&P , NPO status , Patient's Chart, lab work & pertinent test results, reviewed documented beta blocker date and time   History of Anesthesia Complications (+) PONV and history of anesthetic complications  Airway Mallampati: II  TM Distance: >3 FB Neck ROM: full    Dental no notable dental hx. (+) Dental Advisory Given, Teeth Intact   Pulmonary neg pulmonary ROS   Pulmonary exam normal breath sounds clear to auscultation       Cardiovascular Exercise Tolerance: Good negative cardio ROS Normal cardiovascular exam Rhythm:regular Rate:Normal     Neuro/Psych Horner's syndrome resolved  negative psych ROS   GI/Hepatic negative GI ROS, Neg liver ROS,,,  Endo/Other  diabetes, Type 2    Renal/GU negative Renal ROS  negative genitourinary   Musculoskeletal   Abdominal   Peds  Hematology negative hematology ROS (+)   Anesthesia Other Findings   Reproductive/Obstetrics negative OB ROS                             Anesthesia Physical Anesthesia Plan  ASA: 2  Anesthesia Plan: General   Post-op Pain Management: Minimal or no pain anticipated   Induction: Intravenous  PONV Risk Score and Plan: Propofol infusion  Airway Management Planned: Natural Airway and Nasal Cannula  Additional Equipment: None  Intra-op Plan:   Post-operative Plan:   Informed Consent: I have reviewed the patients History and Physical, chart, labs and discussed the procedure including the risks, benefits and alternatives for the proposed anesthesia with the patient or authorized representative who has indicated his/her understanding and acceptance.     Dental Advisory Given  Plan Discussed with: CRNA  Anesthesia Plan Comments:         Anesthesia Quick Evaluation

## 2023-11-24 NOTE — Op Note (Signed)
 [facility] Patient Name: Kendra Wilson Procedure Date: 11/24/2023 12:57 PM MRN: [id number] Date of Birth: 1971-01-27 Attending MD: Carlin POUR. Cindie , [place], [id number] CSN: [id number] Age: 53 Admit Type: Outpatient Procedure:                Colonoscopy Indications:              Screening for colorectal malignant neoplasm Providers:                Carlin POUR. Cindie, DO, [address] [place]                            Goins, Kristine L. Shirlean Balm, Technician Referring MD:              Medicines:                See the Anesthesia note for documentation of the                            administered medications Complications:            No immediate complications. Estimated Blood Loss:     Estimated blood loss was minimal. Procedure:                Pre-Anesthesia Assessment:                           - The anesthesia plan was to use monitored                            anesthesia care (MAC).                           After obtaining informed consent, the colonoscope                            was passed under direct vision. Throughout the                            procedure, the patient's blood pressure, pulse, and                            oxygen saturations were monitored continuously. The                            [id number]-HQ190L ([id number]) scope was introduced through                            the anus and advanced to the the terminal ileum,                            with identification of the appendiceal orifice and                            IC valve. The colonoscopy was performed without                            difficulty.  The patient tolerated the procedure                            well. The quality of the bowel preparation was                            evaluated using the BBPS [employer] Bowel Preparation                            Scale) with scores of: Right Colon = 3, Transverse                            Colon = 3 and Left Colon = 3 (entire mucosa seen                             well with no residual staining, small fragments of                            stool or opaque liquid). The total BBPS score                            equals 9. Scope In: 1:09:06 PM Scope Out: 1:19:43 PM Scope Withdrawal Time: 0 hours 7 minutes 9 seconds  Total Procedure Duration: 0 hours 10 minutes 37 seconds  Findings:      Hemorrhoids were found on perianal exam.      Non-bleeding internal hemorrhoids were found.      Multiple large-mouthed and small-mouthed diverticula were found in the       sigmoid colon and descending colon.      The terminal ileum appeared normal.      Biopsies for histology were taken with a cold forceps from the ascending       colon, transverse colon and descending colon for evaluation of       microscopic colitis. Impression:               - Hemorrhoids found on perianal exam.                           - Non-bleeding internal hemorrhoids.                           - Diverticulosis in the sigmoid colon and in the                            descending colon.                           - The examined portion of the ileum was normal.                           - Biopsies were taken with a cold forceps from the                            ascending colon, transverse colon and descending  colon for evaluation of microscopic colitis. Moderate Sedation:      Per Anesthesia Care Recommendation:           - Patient has a contact number available for                            emergencies. The signs and symptoms of potential                            delayed complications were discussed with the                            patient. Return to normal activities tomorrow.                            Written discharge instructions were provided to the                            patient.                           - Resume previous diet.                           - Continue present medications.                           - Await  pathology results.                           - Repeat colonoscopy in 10 years for screening                            purposes.                           - Return to GI clinic in 8 weeks. Procedure Code(s):        --- Professional ---                           [phone], Colonoscopy, flexible; with biopsy, single                            or multiple Diagnosis Code(s):        --- Professional ---                           Z12.11, Encounter for screening for malignant                            neoplasm of colon                           K64.8, Other hemorrhoids                           K57.30, Diverticulosis of large intestine without  perforation or abscess without bleeding CPT copyright 2022 American Medical Association. All rights reserved. The codes documented in this report are preliminary and upon coder review may  be revised to meet current compliance requirements. Carlin POUR. Cindie, DO Carlin POUR. Cindie, DO 11/24/2023 1:22:51 PM This report has been signed electronically. Number of Addenda: 0

## 2023-11-24 NOTE — Transfer of Care (Signed)
 Immediate Anesthesia Transfer of Care Note  Patient: Kendra Wilson  Procedure(s) Performed: COLONOSCOPY  Patient Location: PACU  Anesthesia Type:MAC  Level of Consciousness: drowsy and patient cooperative  Airway & Oxygen Therapy: Patient Spontanous Breathing  Post-op Assessment: Report given to RN and Post -op Vital signs reviewed and stable  Post vital signs: Reviewed and stable  Last Vitals:  Vitals Value Taken Time  BP 88/47 13:25 11/24/23  Temp    Pulse 75 13:25 11/24/23  Resp 18 13:25 11/24/23  SpO2 96% 13:25 11/24/23    Last Pain:  Vitals:   11/24/23 1304  TempSrc:   PainSc: 4       Patients Stated Pain Goal: 10 (11/24/23 1213)  Complications: No notable events documented.

## 2023-11-24 NOTE — Interval H&P Note (Signed)
 History and Physical Interval Note:  11/24/2023 12:37 PM  Kendra Wilson  has presented today for surgery, with the diagnosis of chronic diarrhea, CA Screen.  The various methods of treatment have been discussed with the patient and family. After consideration of risks, benefits and other options for treatment, the patient has consented to  Procedure(s) with comments: COLONOSCOPY (N/A) - 1:30pm, asa 2 as a surgical intervention.  The patient's history has been reviewed, patient examined, no change in status, stable for surgery.  I have reviewed the patient's chart and labs.  Questions were answered to the patient's satisfaction.     Carlin MARLA Hasty

## 2023-11-24 NOTE — Progress Notes (Signed)
 Kendra Wilson had a medical procedure on 11/24/2023 requiring anesthesia.  She may return to work on 11/26/2023.  Thank you Jon Collet RN [facility]

## 2023-11-24 NOTE — Discharge Instructions (Addendum)
  Colonoscopy Discharge Instructions  Read the instructions outlined below and refer to this sheet in the next few weeks. These discharge instructions provide you with general information on caring for yourself after you leave the hospital. Your doctor may also give you specific instructions. While your treatment has been planned according to the most current medical practices available, unavoidable complications occasionally occur.   ACTIVITY You may resume your regular activity, but move at a slower pace for the next 24 hours.  Take frequent rest periods for the next 24 hours.  Walking will help get rid of the air and reduce the bloated feeling in your belly (abdomen).  No driving for 24 hours (because of the medicine (anesthesia) used during the test).   Do not sign any important legal documents or operate any machinery for 24 hours (because of the anesthesia used during the test).  NUTRITION Drink plenty of fluids.  You may resume your normal diet as instructed by your doctor.  Begin with a light meal and progress to your normal diet. Heavy or fried foods are harder to digest and may make you feel sick to your stomach (nauseated).  Avoid alcoholic beverages for 24 hours or as instructed.  MEDICATIONS You may resume your normal medications unless your doctor tells you otherwise.  WHAT YOU CAN EXPECT TODAY Some feelings of bloating in the abdomen.  Passage of more gas than usual.  Spotting of blood in your stool or on the toilet paper.  IF YOU HAD POLYPS REMOVED DURING THE COLONOSCOPY: No aspirin products for 7 days or as instructed.  No alcohol for 7 days or as instructed.  Eat a soft diet for the next 24 hours.  FINDING OUT THE RESULTS OF YOUR TEST Not all test results are available during your visit. If your test results are not back during the visit, make an appointment with your caregiver to find out the results. Do not assume everything is normal if you have not heard from your  caregiver or the medical facility. It is important for you to follow up on all of your test results.  SEEK IMMEDIATE MEDICAL ATTENTION IF: You have more than a spotting of blood in your stool.  Your belly is swollen (abdominal distention).  You are nauseated or vomiting.  You have a temperature over 101.  You have abdominal pain or discomfort that is severe or gets worse throughout the day.   Your colonoscopy was relatively unremarkable.  I did not find any polyps or evidence of colon cancer.  I recommend repeating colonoscopy in 10 years for colon cancer screening purposes.    Overall, your colon appeared very healthy.  I did not see any active inflammation indicative of underlying inflammatory bowel disease such as Crohn's disease or ulcerative colitis throughout your colon or end portion of your small bowel.  I took biopsies of your colon to further evaluate.  Await pathology results, my office will contact you.  You do have diverticulosis and internal hemorrhoids. I would recommend increasing fiber in your diet or adding OTC Benefiber/Metamucil. Be sure to drink at least 4 to 6 glasses of water  daily. Follow-up with GI in 8 weeks    I hope you have a great rest of your week!  Carlin POUR. Cindie, D.O. Gastroenterology and Hepatology [facility]

## 2023-11-25 ENCOUNTER — Encounter (HOSPITAL_COMMUNITY): Payer: Self-pay | Admitting: Internal Medicine

## 2023-11-25 LAB — SURGICAL PATHOLOGY

## 2023-11-25 NOTE — Anesthesia Postprocedure Evaluation (Signed)
 Anesthesia Post Note  Patient: Kendra Wilson  Procedure(s) Performed: COLONOSCOPY  Patient location during evaluation: Phase II Anesthesia Type: General Level of consciousness: awake Pain management: pain level controlled Vital Signs Assessment: post-procedure vital signs reviewed and stable Respiratory status: spontaneous breathing and respiratory function stable Cardiovascular status: blood pressure returned to baseline and stable Postop Assessment: no headache and no apparent nausea or vomiting Anesthetic complications: no Comments: Late entry   No notable events documented.   Last Vitals:  Vitals:   11/24/23 1323 11/24/23 1326  BP:  (!) 100/59  Pulse: 80 71  Resp: 19 17  Temp: 36.4 C   SpO2: 97% 97%    Last Pain:  Vitals:   11/24/23 1326  TempSrc:   PainSc: 0-No pain                 [name]

## 2023-12-01 ENCOUNTER — Ambulatory Visit: Payer: Self-pay | Admitting: Internal Medicine

## 2024-01-19 ENCOUNTER — Ambulatory Visit: Admitting: Gastroenterology

## 2024-01-22 ENCOUNTER — Ambulatory Visit: Admitting: Gastroenterology

## 2024-02-02 NOTE — Progress Notes (Deleted)
 GI Office Note    Referring Provider: Marvine Rush, MD Primary Care Physician:  Marvine Rush, MD  Primary Gastroenterologist: Carlin POUR. Cindie, DO   Chief Complaint   No chief complaint on file.   History of Present Illness   Kendra Wilson is a 53 y.o. female presenting today for follow up. Last seen in 11/2023. H/o chronic diarrhea.   She has history of multiple allergies noted on blood test in the early 2000s. Many showed weak allergy, including wheat, barley. She is going to upload list to [employer]. Seen by another allergist few years ago and did skin testing. Tested positive for peanut allergy and some environmental allergies. States she did not get adequate explanation of other potential food allergies as outlined by the blood test. She has had issues with multiple foods from coughing, shortness of breath, skin rashes. She tries to avoid foods with similar proteins that she reacts too.   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 If takes more than 2 a day, can have constipation. Current taking 1g BID but hard to isolate the PM dose from her other medications at times. At last ov, advised to take 2g in am only.   Prior Data    Labs 08/2023:  Pancreatic elastase >800. LFTs normal. TTG IgA <2, IgA normal. TSH 1.460. sed rate 8, CRP 3.   CT A/P with contrast 08/17/23:  IMPRESSION: -No acute findings. -Colonic diverticulosis, without radiographic evidence of diverticulitis. -Small hiatal hernia.   PCP completed stool test 08/2023: Cdiff and stool culture negative.     EGD 09/2023: -2cm hh -moderate schatzki ring s/p dilation -gastritis, no h.pylori  Colonoscopy 11/2023: -hemorrhoids found on perianal exam -nonbleeding internal hemorrhoids -diverticulosis -examined ileum normal -random colon biopsies neg -colonoscopy in 10 years   Medications   Current Outpatient Medications  Medication  Sig Dispense Refill   acetaminophen (TYLENOL) 500 MG tablet Take 1,000 mg by mouth every 6 (six) hours as needed for headache.     Black Cohosh 540 MG CAPS Take 1 capsule by mouth daily.     cetirizine (ZYRTEC) 10 MG tablet Take 1 tablet by mouth daily.     colestipol  (COLESTID ) 1 g tablet TAKE 1 TABLET (1 G TOTAL) BY MOUTH 3 (THREE) TIMES DAILY. (Patient taking differently: Take 1 g by mouth 2 (two) times daily.) 90 tablet 2   Dulaglutide (TRULICITY) 0.75 MG/0.5ML SOAJ Inject 0.75 mg into the skin once a week.     glucose blood (ONETOUCH VERIO) test strip Use as directed to check blood glucose two times daily 200 strip 2   Insulin Pen Needle (B-D ULTRAFINE III SHORT PEN) 31G X 8 MM MISC 1 each by Does not apply route as directed. 100 each 3   Lancets Thin MISC 1 each by Other route 4 (four) times daily. 150 each 5   Omega-3 Fatty Acids (FISH OIL) 1200 MG CAPS Take 1 capsule by mouth daily.     RABEprazole  (ACIPHEX ) 20 MG tablet Take 1 tablet (20 mg total) by mouth 2 (two) times daily before a meal. 60 tablet 3   No current facility-administered medications for this visit.    Allergies   Allergies as of 02/03/2024 - Review Complete 11/24/2023  Allergen Reaction Noted   Codeine Nausea And Vomiting 07/03/2017   Demerol [meperidine]  07/03/2017   Morphine and codeine Nausea And Vomiting 07/03/2017   Peanut allergen powder-dnfp  08/26/2023  Statins Swelling 01/28/2018     Past Medical History   Past Medical History:  Diagnosis Date   Diabetes mellitus without complication (HCC)    Diabetes mellitus, type II (HCC)    History of kidney stones    Hyperlipidemia    PONV (postoperative nausea and vomiting)     Past Surgical History   Past Surgical History:  Procedure Laterality Date   CARPAL TUNNEL RELEASE Left    CATARACT EXTRACTION W/PHACO Left 09/23/2021   Procedure: CATARACT EXTRACTION PHACO AND INTRAOCULAR LENS PLACEMENT (IOC);  Surgeon: Harrie Agent, MD;  Location: [facility];   Service: Ophthalmology;  Laterality: Left;  CDE: 8.95   CHOLECYSTECTOMY     COLONOSCOPY N/A 11/24/2023   Procedure: COLONOSCOPY;  Surgeon: Cindie Carlin POUR, DO;  Location: [facility];  Service: Endoscopy;  Laterality: N/A;  1:30pm, asa 2   ESOPHAGEAL DILATION N/A 09/01/2023   Procedure: DILATION, ESOPHAGUS;  Surgeon: Cindie Carlin POUR, DO;  Location: [facility];  Service: Endoscopy;  Laterality: N/A;   ESOPHAGOGASTRODUODENOSCOPY N/A 09/01/2023   Procedure: EGD (ESOPHAGOGASTRODUODENOSCOPY);  Surgeon: Cindie Carlin POUR, DO;  Location: [facility];  Service: Endoscopy;  Laterality: N/A;  10:15 am, asa 2   LITHOTRIPSY  2018   TUBAL LIGATION     WISDOM TOOTH EXTRACTION      Past Family History   Family History  Problem Relation Age of Onset   Colon cancer Neg Hx    Celiac disease Neg Hx    Inflammatory bowel disease Neg Hx     Past Social History   Social History   Socioeconomic History   Marital status: Single    Spouse name: Not on file   Number of children: Not on file   Years of education: Not on file   Highest education level: Not on file  Occupational History   Not on file  Tobacco Use   Smoking status: Never   Smokeless tobacco: Never  Vaping Use   Vaping status: Never Used  Substance and Sexual Activity   Alcohol use: No   Drug use: No   Sexual activity: Yes    Birth control/protection: Surgical  Other Topics Concern   Not on file  Social History Narrative   Not on file   Social Drivers of Health   Financial Resource Strain: Not on file  Food Insecurity: Not on file  Transportation Needs: Not on file  Physical Activity: Not on file  Stress: Not on file  Social Connections: Not on file  Intimate Partner Violence: Not on file    Review of Systems   General: Negative for anorexia, weight loss, fever, chills, fatigue, weakness. ENT: Negative for hoarseness, difficulty swallowing , nasal congestion. CV: Negative for chest pain, angina, palpitations,  dyspnea on exertion, peripheral edema.  Respiratory: Negative for dyspnea at rest, dyspnea on exertion, cough, sputum, wheezing.  GI: See history of present illness. GU:  Negative for dysuria, hematuria, urinary incontinence, urinary frequency, nocturnal urination.  Endo: Negative for unusual weight change.     Physical Exam   LMP 08/31/2017    General: Well-nourished, well-developed in no acute distress.  Eyes: No icterus. Mouth: Oropharyngeal mucosa moist and pink   Lungs: Clear to auscultation bilaterally.  Heart: Regular rate and rhythm, no murmurs rubs or gallops.  Abdomen: Bowel sounds are normal, nontender, nondistended, no hepatosplenomegaly or masses,  no abdominal bruits or hernia , no rebound or guarding.  Rectal: not performed Extremities: No lower extremity edema. No clubbing or  deformities. Neuro: Alert and oriented x 4   Skin: Warm and dry, no jaundice.   Psych: Alert and cooperative, normal mood and affect.  Labs   *** Imaging Studies   No results found.  Assessment/Plan:           Sonny RAMAN. Ezzard, MHS, PA-C [facility]

## 2024-02-03 ENCOUNTER — Ambulatory Visit: Admitting: Gastroenterology

## 2024-02-03 ENCOUNTER — Telehealth: Payer: Self-pay | Admitting: Gastroenterology

## 2024-02-03 NOTE — Telephone Encounter (Signed)
 Patient no showed today.  If she calls for appt, ok to schedule nonurgent. Please, send letter regarding missed appt.

## 2024-02-04 ENCOUNTER — Other Ambulatory Visit: Payer: Self-pay | Admitting: Gastroenterology

## 2024-04-06 ENCOUNTER — Other Ambulatory Visit (HOSPITAL_COMMUNITY): Payer: Self-pay | Admitting: Family Medicine

## 2024-04-06 DIAGNOSIS — Z1231 Encounter for screening mammogram for malignant neoplasm of breast: Secondary | ICD-10-CM

## 2024-04-27 ENCOUNTER — Encounter (HOSPITAL_COMMUNITY): Payer: Self-pay

## 2024-04-27 ENCOUNTER — Inpatient Hospital Stay
Admission: RE | Admit: 2024-04-27 | Discharge: 2024-04-27 | Disposition: A | Payer: Self-pay | Source: Ambulatory Visit | Attending: Family Medicine | Admitting: Family Medicine

## 2024-04-27 ENCOUNTER — Other Ambulatory Visit (HOSPITAL_COMMUNITY): Payer: Self-pay | Admitting: Family Medicine

## 2024-04-27 ENCOUNTER — Ambulatory Visit (HOSPITAL_COMMUNITY)
Admission: RE | Admit: 2024-04-27 | Discharge: 2024-04-27 | Disposition: A | Discharge status: Home / Self Care | Source: Ambulatory Visit | Attending: Family Medicine | Admitting: Family Medicine

## 2024-04-27 ENCOUNTER — Inpatient Hospital Stay
Admission: RE | Admit: 2024-04-27 | Discharge: 2024-04-27 | Disposition: A | Discharge status: Home / Self Care | Payer: Self-pay | Source: Ambulatory Visit | Attending: Family Medicine | Admitting: Family Medicine

## 2024-04-27 DIAGNOSIS — Z1231 Encounter for screening mammogram for malignant neoplasm of breast: Secondary | ICD-10-CM
